# Patient Record
Sex: Female | Born: 1985 | Race: White | Hispanic: No | Marital: Married | Smoking: Never smoker
Health system: Southern US, Community
[De-identification: ages and names within clinical notes are randomized; demographics above are authoritative.]

## PROBLEM LIST (undated history)

## (undated) ENCOUNTER — Inpatient Hospital Stay (HOSPITAL_COMMUNITY): Payer: Self-pay

## (undated) DIAGNOSIS — Z8619 Personal history of other infectious and parasitic diseases: Secondary | ICD-10-CM

## (undated) DIAGNOSIS — R1110 Vomiting, unspecified: Secondary | ICD-10-CM

## (undated) HISTORY — PX: CHOLECYSTECTOMY: SHX55

## (undated) HISTORY — PX: OTHER SURGICAL HISTORY: SHX169

## (undated) HISTORY — DX: Personal history of other infectious and parasitic diseases: Z86.19

## (undated) HISTORY — DX: Vomiting, unspecified: R11.10

---

## 2009-04-15 ENCOUNTER — Inpatient Hospital Stay (HOSPITAL_COMMUNITY): Admission: AD | Admit: 2009-04-15 | Discharge: 2009-04-15 | Payer: Self-pay | Admitting: Obstetrics and Gynecology

## 2009-04-26 ENCOUNTER — Inpatient Hospital Stay (HOSPITAL_COMMUNITY): Admission: AD | Admit: 2009-04-26 | Discharge: 2009-04-27 | Payer: Self-pay | Admitting: Obstetrics and Gynecology

## 2009-09-22 ENCOUNTER — Inpatient Hospital Stay (HOSPITAL_COMMUNITY): Admission: AD | Admit: 2009-09-22 | Discharge: 2009-09-22 | Payer: Self-pay | Admitting: Obstetrics and Gynecology

## 2009-09-30 ENCOUNTER — Inpatient Hospital Stay (HOSPITAL_COMMUNITY): Admission: AD | Admit: 2009-09-30 | Discharge: 2009-09-30 | Payer: Self-pay | Admitting: Obstetrics and Gynecology

## 2009-11-06 ENCOUNTER — Inpatient Hospital Stay (HOSPITAL_COMMUNITY): Admission: AD | Admit: 2009-11-06 | Discharge: 2009-11-08 | Payer: Self-pay | Admitting: Obstetrics & Gynecology

## 2011-02-28 LAB — CBC
HCT: 30.1 % — ABNORMAL LOW (ref 36.0–46.0)
MCV: 88.9 fL (ref 78.0–100.0)
Platelets: 236 10*3/uL (ref 150–400)
RBC: 3.38 MIL/uL — ABNORMAL LOW (ref 3.87–5.11)
RDW: 14.2 % (ref 11.5–15.5)
WBC: 13.9 10*3/uL — ABNORMAL HIGH (ref 4.0–10.5)
WBC: 19.8 10*3/uL — ABNORMAL HIGH (ref 4.0–10.5)

## 2011-02-28 LAB — RPR: RPR Ser Ql: NONREACTIVE

## 2011-02-28 LAB — COMPREHENSIVE METABOLIC PANEL
CO2: 20 mEq/L (ref 19–32)
Calcium: 9.2 mg/dL (ref 8.4–10.5)
Chloride: 105 mEq/L (ref 96–112)
Glucose, Bld: 89 mg/dL (ref 70–99)
Total Protein: 6 g/dL (ref 6.0–8.3)

## 2011-02-28 LAB — LACTATE DEHYDROGENASE: LDH: 184 U/L (ref 94–250)

## 2011-03-07 LAB — URINALYSIS, DIPSTICK ONLY
Bilirubin Urine: NEGATIVE
Glucose, UA: 250 mg/dL — AB
Ketones, ur: >80 mg/dL — AB
Leukocytes, UA: NEGATIVE
Protein, ur: NEGATIVE mg/dL
Specific Gravity, Urine: 1.015 (ref 1.005–1.030)

## 2011-03-07 LAB — URINE MICROSCOPIC-ADD ON

## 2011-03-07 LAB — URINALYSIS, ROUTINE W REFLEX MICROSCOPIC
Glucose, UA: NEGATIVE mg/dL
Ketones, ur: >80 mg/dL — AB
Nitrite: NEGATIVE
Specific Gravity, Urine: >1.03 — ABNORMAL HIGH (ref 1.005–1.030)
pH: 5.5 (ref 5.0–8.0)
pH: 6 (ref 5.0–8.0)

## 2012-11-27 NOTE — L&D Delivery Note (Signed)
SVD of VMI.  Cord pH and weight pending.  EBL 350cc.  [place]at[place] to L&D.   Head delivered LOA with body delivered atraumatically.  Baby to abdomen.  Cord clamped and cut.  Cord pH obtained.  Placenta delivered S/I/3VC.  Fundus firmed with pitocin and massage.  Vaginal laceration and bilateral periurethral laceration repaired with 3-0 Vicryl in the normal fashion.  Mom and baby stable.

## 2013-02-21 ENCOUNTER — Encounter (HOSPITAL_COMMUNITY): Payer: Self-pay | Admitting: *Deleted

## 2013-02-21 ENCOUNTER — Inpatient Hospital Stay (HOSPITAL_COMMUNITY)
Admission: AD | Admit: 2013-02-21 | Discharge: 2013-02-21 | Disposition: A | Discharge status: Home / Self Care | Payer: BC Managed Care – PPO | Source: Ambulatory Visit | Attending: Obstetrics and Gynecology | Admitting: Obstetrics and Gynecology

## 2013-02-21 DIAGNOSIS — O219 Vomiting of pregnancy, unspecified: Secondary | ICD-10-CM

## 2013-02-21 DIAGNOSIS — O210 Mild hyperemesis gravidarum: Secondary | ICD-10-CM | POA: Insufficient documentation

## 2013-02-21 LAB — URINALYSIS, ROUTINE W REFLEX MICROSCOPIC
Glucose, UA: NEGATIVE mg/dL
Hgb urine dipstick: NEGATIVE
Leukocytes, UA: NEGATIVE

## 2013-02-21 MED ORDER — PROMETHAZINE HCL 25 MG/ML IJ SOLN
25.0000 mg | Freq: Once | INTRAVENOUS | Status: AC
  Administered 2013-02-21: 25 mg via INTRAVENOUS
  Filled 2013-02-21: qty 1

## 2013-02-21 MED ORDER — ONDANSETRON HCL 4 MG/2ML IJ SOLN
4.0000 mg | INTRAMUSCULAR | Status: AC
  Administered 2013-02-21: 4 mg via INTRAVENOUS
  Filled 2013-02-21: qty 2

## 2013-02-21 NOTE — MAU Provider Note (Signed)
  History     CSN: [id number]  Arrival date and time: 02/21/13 4098   First Provider Initiated Contact with Patient 02/21/13 2129      Chief Complaint  Patient presents with  . Morning Sickness  . Dehydration   HPI  Barbara Neal is a 27 y.o. G2P1001 at [redacted]w[redacted]d who presents today with nausea and vomiting. She states that she has been very nauseous for the whole pregnancy at this point, and has not been vomiting much. However, she has been unable to eat or drink due to the nausea. She denies any abdominal pain or vaginal bleeding. She denies any complications with the pregnancy at this time. She was seen by her primary OB last week and had an ultrasound in the office. She states that "everything was ok" at the ultrasound.   History reviewed. No pertinent past medical history.  Past Surgical History  Procedure Laterality Date  . Cholecystectomy    . Arm surgery      History reviewed. No pertinent family history.  History  Substance Use Topics  . Smoking status: Never Smoker   . Smokeless tobacco: Never Used  . Alcohol Use: No    Allergies: No Known Allergies  No prescriptions prior to admission    Review of Systems  Constitutional: Negative for fever and chills.  Eyes: Negative for blurred vision.  Gastrointestinal: Positive for nausea and vomiting. Negative for abdominal pain, diarrhea and constipation.  Genitourinary: Negative for dysuria, urgency and frequency.  Musculoskeletal: Negative for myalgias.  Neurological: Negative for dizziness and headaches.   Physical Exam   Blood pressure 127/72, pulse 85, temperature 98.1 F (36.7 C), resp. rate 20, height 5\' 4"  (1.626 m), weight 83.734 kg (184 lb 9.6 oz), last menstrual period 12/24/2012, SpO2 100.00%.  Physical Exam  Nursing note and vitals reviewed. Constitutional: She is oriented to person, place, and time. She appears well-developed and well-nourished. No distress.  Cardiovascular: Normal rate.    Respiratory: Effort normal.  GI: Soft. She exhibits no distension. There is no tenderness.  Neurological: She is alert and oriented to person, place, and time.  Skin: Skin is warm and dry.  Psychiatric: She has a normal mood and affect.    MAU Course  Procedures  Results for orders placed during the hospital encounter of 02/21/13 (from the past 24 hour(s))  URINALYSIS, ROUTINE W REFLEX MICROSCOPIC     Status: Abnormal   Collection Time    02/21/13  7:20 PM      Result Value Range   Color, Urine YELLOW  YELLOW   APPearance CLEAR  CLEAR   Specific Gravity, Urine >1.030 (*) 1.005 - 1.030   pH 6.0  5.0 - 8.0   Glucose, UA NEGATIVE  NEGATIVE mg/dL   Hgb urine dipstick NEGATIVE  NEGATIVE   Bilirubin Urine NEGATIVE  NEGATIVE   Ketones, ur 40 (*) NEGATIVE mg/dL   Protein, ur NEGATIVE  NEGATIVE mg/dL   Urobilinogen, UA 0.2  0.0 - 1.0 mg/dL   Nitrite NEGATIVE  NEGATIVE   Leukocytes, UA NEGATIVE  NEGATIVE    2205: pt reports feeling much better and ready to go home.  Assessment and Plan   1. Nausea and vomiting in pregnancy prior to [redacted] weeks gestation    Discussed comfort measures First trimester danger signs reviewed FU with PCP as scheduled  Tawnya Crook 02/21/2013, 9:29 PM

## 2013-02-21 NOTE — MAU Note (Signed)
I'm really nauseated and just do not want to eat. No as much vomiting. I'm dehydrated

## 2013-02-26 LAB — OB RESULTS CONSOLE HEPATITIS B SURFACE ANTIGEN: Hepatitis B Surface Ag: NEGATIVE

## 2013-02-26 LAB — OB RESULTS CONSOLE ABO/RH

## 2013-02-26 LAB — OB RESULTS CONSOLE ANTIBODY SCREEN: Antibody Screen: NEGATIVE

## 2013-02-26 LAB — OB RESULTS CONSOLE RUBELLA ANTIBODY, IGM: Rubella: IMMUNE

## 2013-02-28 ENCOUNTER — Inpatient Hospital Stay (HOSPITAL_COMMUNITY)
Admission: AD | Admit: 2013-02-28 | Discharge: 2013-02-28 | Disposition: A | Discharge status: Home / Self Care | Payer: BC Managed Care – PPO | Source: Ambulatory Visit | Attending: Obstetrics & Gynecology | Admitting: Obstetrics & Gynecology

## 2013-02-28 ENCOUNTER — Encounter (HOSPITAL_COMMUNITY): Payer: Self-pay | Admitting: *Deleted

## 2013-02-28 DIAGNOSIS — O210 Mild hyperemesis gravidarum: Secondary | ICD-10-CM | POA: Insufficient documentation

## 2013-02-28 DIAGNOSIS — R197 Diarrhea, unspecified: Secondary | ICD-10-CM | POA: Insufficient documentation

## 2013-02-28 DIAGNOSIS — O219 Vomiting of pregnancy, unspecified: Secondary | ICD-10-CM

## 2013-02-28 LAB — COMPREHENSIVE METABOLIC PANEL
ALT: 38 U/L — ABNORMAL HIGH (ref 0–35)
AST: 29 U/L (ref 0–37)
Albumin: 3.9 g/dL (ref 3.5–5.2)
BUN: 8 mg/dL (ref 6–23)
Chloride: 99 mEq/L (ref 96–112)
GFR calc Af Amer: >90 mL/min (ref >90)
GFR calc non Af Amer: >90 mL/min (ref >90)
Glucose, Bld: 159 mg/dL — ABNORMAL HIGH (ref 70–99)
Potassium: 3.8 mEq/L (ref 3.5–5.1)
Sodium: 132 mEq/L — ABNORMAL LOW (ref 135–145)
Total Bilirubin: 0.4 mg/dL (ref 0.3–1.2)

## 2013-02-28 LAB — URINALYSIS, ROUTINE W REFLEX MICROSCOPIC
Glucose, UA: NEGATIVE mg/dL
Protein, ur: 30 mg/dL — AB
Specific Gravity, Urine: >1.03 — ABNORMAL HIGH (ref 1.005–1.030)

## 2013-02-28 LAB — URINE MICROSCOPIC-ADD ON

## 2013-02-28 MED ORDER — ONDANSETRON HCL 4 MG/2ML IJ SOLN
4.0000 mg | Freq: Once | INTRAMUSCULAR | Status: AC
  Administered 2013-02-28: 4 mg via INTRAVENOUS
  Filled 2013-02-28: qty 2

## 2013-02-28 MED ORDER — PROMETHAZINE HCL 25 MG/ML IJ SOLN
25.0000 mg | Freq: Once | INTRAVENOUS | Status: AC
  Administered 2013-02-28: 25 mg via INTRAVENOUS
  Filled 2013-02-28: qty 1

## 2013-02-28 MED ORDER — M.V.I. ADULT IV INJ
Freq: Once | INTRAVENOUS | Status: AC
  Administered 2013-02-28: 18:00:00 via INTRAVENOUS
  Filled 2013-02-28: qty 1000

## 2013-02-28 MED ORDER — PROMETHAZINE HCL 25 MG RE SUPP: 25.0000 mg | Freq: Four times a day (QID) | RECTAL | Status: DC | PRN

## 2013-02-28 MED ORDER — ONDANSETRON HCL 4 MG PO TABS: 4.0000 mg | ORAL_TABLET | Freq: Four times a day (QID) | ORAL | Status: DC

## 2013-02-28 NOTE — MAU Note (Signed)
Patient states she has been unable to keep anything down for 2 days. Medication is not working. Has diarrhea 3-4 times in the past week. Denies pain or bleeding.

## 2013-02-28 NOTE — MAU Provider Note (Signed)
History     CSN: [id number]  Arrival date and time: 02/28/13 1508   First Provider Initiated Contact with Patient 02/28/13 1610      Chief Complaint  Patient presents with  . Emesis During Pregnancy  . Diarrhea   HPI Ms. RUTHANNA MACCHIA is a 27 y.o. G2P1001 at [redacted]w[redacted]d who presents to MAU today with N/V. The patient was seen on 02/21/13 and required IV hydration. She was sent home with Phenergan and Zofran PO. She felt better x 1 day and has been having N/V/D again since Sunday. She has not been able to keep much down at all. She denies abdominal pain, vaginal bleeding, abnormal discharge or fever.    OB History   Grav Para Term Preterm Abortions TAB SAB Ect Mult Living   2 1 1       1       Past Medical History  Diagnosis Date  . Medical history non-contributory     Past Surgical History  Procedure Laterality Date  . Cholecystectomy    . Arm surgery      History reviewed. No pertinent family history.  History  Substance Use Topics  . Smoking status: Never Smoker   . Smokeless tobacco: Never Used  . Alcohol Use: No    Allergies: No Known Allergies  Prescriptions prior to admission  Medication Sig Dispense Refill  . ondansetron (ZOFRAN) 4 MG tablet Take 4 mg by mouth every 8 (eight) hours as needed for nausea.      . promethazine (PHENERGAN) 12.5 MG tablet Take 12.5 mg by mouth every 6 (six) hours as needed for nausea.        Review of Systems  Constitutional: Negative for fever and malaise/fatigue.  Gastrointestinal: Positive for nausea, vomiting and diarrhea. Negative for abdominal pain and constipation.  Genitourinary: Negative for dysuria, urgency and frequency.       NEG - vaginal bleeding or discharge  Musculoskeletal: Negative for back pain.  Neurological: Positive for dizziness, weakness and headaches. Negative for loss of consciousness.   Physical Exam   Blood pressure 115/81, pulse 86, temperature 98.4 F (36.9 C), temperature source Oral, resp.  rate 16, height 5\' 4"  (1.626 m), weight 177 lb 6.4 oz (80.468 kg), last menstrual period 12/24/2012, SpO2 100.00%.  Physical Exam  Constitutional: She is oriented to person, place, and time. She appears well-developed and well-nourished. No distress.  HENT:  Head: Normocephalic and atraumatic.  Cardiovascular: Normal rate, regular rhythm and normal heart sounds.   Respiratory: Effort normal and breath sounds normal. No respiratory distress.  GI: Soft. Bowel sounds are normal. She exhibits no distension and no mass. There is tenderness (mild tenderness to palpation of the upper abdomen). There is no rebound and no guarding.  Neurological: She is alert and oriented to person, place, and time.  Skin: Skin is warm and dry. No erythema.  Psychiatric: She has a normal mood and affect.   Results for orders placed during the hospital encounter of 02/28/13 (from the past 24 hour(s))  URINALYSIS, ROUTINE W REFLEX MICROSCOPIC     Status: Abnormal   Collection Time    02/28/13  3:22 PM      Result Value Range   Color, Urine AMBER (*) YELLOW   APPearance HAZY (*) CLEAR   Specific Gravity, Urine >1.030 (*) 1.005 - 1.030   pH 6.0  5.0 - 8.0   Glucose, UA NEGATIVE  NEGATIVE mg/dL   Hgb urine dipstick TRACE (*) NEGATIVE   Bilirubin Urine  SMALL (*) NEGATIVE   Ketones, ur >80 (*) NEGATIVE mg/dL   Protein, ur 30 (*) NEGATIVE mg/dL   Urobilinogen, UA 0.2  0.0 - 1.0 mg/dL   Nitrite NEGATIVE  NEGATIVE   Leukocytes, UA SMALL (*) NEGATIVE  URINE MICROSCOPIC-ADD ON     Status: Abnormal   Collection Time    02/28/13  3:22 PM      Result Value Range   Squamous Epithelial / LPF FEW (*) RARE   WBC, UA 3-6  <3 WBC/hpf   Bacteria, UA FEW (*) RARE   Urine-Other MUCOUS PRESENT      MAU Course  Procedures None  MDM Discussed patient with Dr. Langston Masker. IV phenergan infusion with 4 mg Zofran today. CMP to check electrolytes and possible MV bag.   CMP results delayed and unavailable when second bag needed. 1 L  LR with MV ordered Patient feels significant improvement  Assessment and Plan  A: Nausea and vomiting in pregnancy  P: Discharge home Rx for Zofran and Phenergan suppositories sent to the patient's pharmacy Patient encouraged to follow-up with [facility] as scheduled or earlier if symptoms worsen Encouraged increased PO hydration as tolerated Patient may return to MAU as needed or if her condition were to change or worsen  Freddi Starr, PA-C  02/28/2013, 4:12 PM

## 2013-03-01 LAB — URINE CULTURE: Colony Count: 35000

## 2013-03-09 ENCOUNTER — Encounter (HOSPITAL_COMMUNITY): Payer: Self-pay | Admitting: *Deleted

## 2013-03-09 ENCOUNTER — Inpatient Hospital Stay (HOSPITAL_COMMUNITY)
Admission: AD | Admit: 2013-03-09 | Discharge: 2013-03-09 | Disposition: A | Discharge status: Home / Self Care | Payer: BC Managed Care – PPO | Source: Ambulatory Visit | Attending: Obstetrics and Gynecology | Admitting: Obstetrics and Gynecology

## 2013-03-09 DIAGNOSIS — O210 Mild hyperemesis gravidarum: Secondary | ICD-10-CM | POA: Insufficient documentation

## 2013-03-09 LAB — URINALYSIS, ROUTINE W REFLEX MICROSCOPIC
Glucose, UA: NEGATIVE mg/dL
Hgb urine dipstick: NEGATIVE
Specific Gravity, Urine: >1.03 — ABNORMAL HIGH (ref 1.005–1.030)

## 2013-03-09 LAB — URINE MICROSCOPIC-ADD ON

## 2013-03-09 MED ORDER — LACTATED RINGERS IV SOLN: INTRAVENOUS | Status: DC

## 2013-03-09 MED ORDER — ONDANSETRON 8 MG PO TBDP: 8.0000 mg | ORAL_TABLET | Freq: Once | ORAL | Status: DC

## 2013-03-09 MED ORDER — PROMETHAZINE HCL 25 MG/ML IJ SOLN
12.5000 mg | Freq: Once | INTRAMUSCULAR | Status: AC
  Administered 2013-03-09: 12.5 mg via INTRAVENOUS
  Filled 2013-03-09: qty 1

## 2013-03-09 MED ORDER — PYRIDOXINE HCL 100 MG/ML IJ SOLN
100.0000 mg | Freq: Once | INTRAMUSCULAR | Status: AC
  Administered 2013-03-09: 100 mg via INTRAVENOUS
  Filled 2013-03-09: qty 1

## 2013-03-09 MED ORDER — METOCLOPRAMIDE HCL 5 MG/ML IJ SOLN
10.0000 mg | Freq: Once | INTRAMUSCULAR | Status: AC
  Administered 2013-03-09: 10 mg via INTRAVENOUS
  Filled 2013-03-09: qty 2

## 2013-03-09 MED ORDER — METOCLOPRAMIDE HCL 10 MG PO TABS: 10.0000 mg | ORAL_TABLET | Freq: Four times a day (QID) | ORAL | Status: DC

## 2013-03-09 NOTE — MAU Note (Signed)
Has been throwing up for 2 weeks. Has been seen in er 3x this pregnancy

## 2013-03-09 NOTE — MAU Provider Note (Signed)
History     CSN: [id number]  Arrival date and time: 03/09/13 1207   None     Chief Complaint  Patient presents with  . Dehydration   HPI This is a 27 y.o. female at [redacted]w[redacted]d who presents with persistent nausea and vomiting. Has had this for 2 weeks. Denies fever or diarrhea.  Wanted to make sure baby was all right.   RN Note:   Has been throwing up for 2 weeks. Has been seen in er 3x this pregnancy      OB History   Grav Para Term Preterm Abortions TAB SAB Ect Mult Living   2 1 1       1       Past Medical History  Diagnosis Date  . Medical history non-contributory     Past Surgical History  Procedure Laterality Date  . Cholecystectomy    . Arm surgery      History reviewed. No pertinent family history.  History  Substance Use Topics  . Smoking status: Never Smoker   . Smokeless tobacco: Never Used  . Alcohol Use: No    Allergies: No Known Allergies  Prescriptions prior to admission  Medication Sig Dispense Refill  . ondansetron (ZOFRAN) 4 MG tablet Take 4 mg by mouth every 8 (eight) hours as needed for nausea.      . promethazine (PHENERGAN) 12.5 MG tablet Take 12.5 mg by mouth at bedtime as needed for nausea.       . promethazine (PHENERGAN) 25 MG suppository Place 1 suppository (25 mg total) rectally every 6 (six) hours as needed for nausea.  12 each  0    Review of Systems  Constitutional: Negative for fever, chills and malaise/fatigue.  Gastrointestinal: Positive for nausea and vomiting. Negative for abdominal pain, diarrhea and constipation.  Genitourinary: Negative for dysuria.  Neurological: Positive for weakness. Negative for dizziness and headaches.   Physical Exam   Blood pressure 118/73, pulse 82, temperature 97.5 F (36.4 C), temperature source Oral, resp. rate 18, last menstrual period 12/24/2012.  Physical Exam  Constitutional: She is oriented to person, place, and time. She appears well-developed. No distress.  HENT:  Head:  Normocephalic.  Cardiovascular: Normal rate.   Respiratory: Effort normal.  GI: Soft. She exhibits no distension and no mass. There is no tenderness. There is no rebound and no guarding.  Musculoskeletal: Normal range of motion.  Neurological: She is alert and oriented to person, place, and time.  Skin: Skin is warm and dry.  Psychiatric: She has a normal mood and affect.    MAU Course  Procedures  IV fluids given. Phenergan, Reglan, and Vitamin B6 was given with good relief. Discussed with Dr Arelia Sneddon.    Assessment and Plan  A:  SIUP at [redacted]w[redacted]d       Hyperemesis  P:  Discharge home         Medication List    TAKE these medications       metoCLOPramide 10 MG tablet  Commonly known as:  REGLAN  Take 1 tablet (10 mg total) by mouth 4 (four) times daily.     ondansetron 4 MG tablet  Commonly known as:  ZOFRAN  Take 4 mg by mouth every 8 (eight) hours as needed for nausea.     promethazine 12.5 MG tablet  Commonly known as:  PHENERGAN  Take 12.5 mg by mouth at bedtime as needed for nausea.     promethazine 25 MG suppository  Commonly known as:  PHENERGAN  Place 1 suppository (25 mg total) rectally every 6 (six) hours as needed for nausea.             Follow up in office  [facility] 03/09/2013, 1:43 PM

## 2013-03-10 LAB — URINE CULTURE
Colony Count: NO GROWTH
Culture: NO GROWTH

## 2013-03-20 ENCOUNTER — Encounter (HOSPITAL_COMMUNITY): Payer: Self-pay | Admitting: *Deleted

## 2013-03-20 ENCOUNTER — Inpatient Hospital Stay (HOSPITAL_COMMUNITY)
Admission: AD | Admit: 2013-03-20 | Discharge: 2013-03-20 | Disposition: A | Discharge status: Home / Self Care | Payer: BC Managed Care – PPO | Source: Ambulatory Visit | Attending: Obstetrics & Gynecology | Admitting: Obstetrics & Gynecology

## 2013-03-20 DIAGNOSIS — O210 Mild hyperemesis gravidarum: Secondary | ICD-10-CM | POA: Insufficient documentation

## 2013-03-20 DIAGNOSIS — O219 Vomiting of pregnancy, unspecified: Secondary | ICD-10-CM

## 2013-03-20 LAB — URINALYSIS, ROUTINE W REFLEX MICROSCOPIC
Hgb urine dipstick: NEGATIVE
Nitrite: NEGATIVE
Protein, ur: 30 mg/dL — AB
Specific Gravity, Urine: >1.03 — ABNORMAL HIGH (ref 1.005–1.030)
Urobilinogen, UA: 0.2 mg/dL (ref 0.0–1.0)

## 2013-03-20 LAB — URINE MICROSCOPIC-ADD ON

## 2013-03-20 MED ORDER — DEXTROSE 5 % IN LACTATED RINGERS IV BOLUS
1000.0000 mL | Freq: Once | INTRAVENOUS | Status: AC
  Administered 2013-03-20: 1000 mL via INTRAVENOUS

## 2013-03-20 MED ORDER — ONDANSETRON 8 MG/NS 50 ML IVPB
8.0000 mg | Freq: Once | INTRAVENOUS | Status: AC
  Administered 2013-03-20: 8 mg via INTRAVENOUS
  Filled 2013-03-20: qty 8

## 2013-03-20 MED ORDER — PROMETHAZINE HCL 25 MG/ML IJ SOLN
25.0000 mg | Freq: Once | INTRAMUSCULAR | Status: AC
  Administered 2013-03-20: 25 mg via INTRAVENOUS
  Filled 2013-03-20: qty 1

## 2013-03-20 NOTE — MAU Note (Signed)
Ongoing nausea and vomiting.  Is on zofran and phenergan something else, doesn't really seem to help.

## 2013-03-20 NOTE — MAU Note (Signed)
Has lost 15#, 10 since end of March

## 2013-03-20 NOTE — MAU Provider Note (Signed)
History     CSN: [id number]  Arrival date and time: 03/20/13 1250   First Provider Initiated Contact with Patient 03/20/13 1336      Chief Complaint  Patient presents with  . Morning Sickness   HPI 27 y.o. G2P1001 at [redacted]w[redacted]d with ongoing n/v. Using scopolamine patch since yesterday, taking diclegis, zofran ODT, phenergan suppository. Nothing is helping. Has trouble keeping down pills and Zofran ODT. Has lost 10 lbs since 3/28. States she has lost 15 lbs all together.   Past Medical History  Diagnosis Date  . Medical history non-contributory     Past Surgical History  Procedure Laterality Date  . Cholecystectomy    . Arm surgery      History reviewed. No pertinent family history.  History  Substance Use Topics  . Smoking status: Never Smoker   . Smokeless tobacco: Never Used  . Alcohol Use: No    Allergies: No Known Allergies  Prescriptions prior to admission  Medication Sig Dispense Refill  . Doxylamine-Pyridoxine (DICLEGIS) 10-10 MG TBEC Take 1 tablet by mouth 3 (three) times daily. 1 tablet in the morning, 1 tablet at lunch & 2 tablets at bedtime      . ondansetron (ZOFRAN) 4 MG tablet Take 4 mg by mouth every 6 (six) hours as needed for nausea.       . promethazine (PHENERGAN) 12.5 MG tablet Take 12.5 mg by mouth at bedtime as needed for nausea.       . promethazine (PHENERGAN) 25 MG suppository Place 25 mg rectally at bedtime as needed for nausea.      . ranitidine (ZANTAC) 150 MG tablet Take 150 mg by mouth 2 (two) times daily.      Marland Kitchen scopolamine (TRANSDERM-SCOP) 1.5 MG Place 1 patch onto the skin every 3 (three) days.      . [DISCONTINUED] promethazine (PHENERGAN) 25 MG suppository Place 1 suppository (25 mg total) rectally every 6 (six) hours as needed for nausea.  12 each  0    Review of Systems  Constitutional: Positive for malaise/fatigue. Negative for fever.  Respiratory: Negative.   Cardiovascular: Negative.   Gastrointestinal: Positive for nausea and  vomiting. Negative for abdominal pain.  Genitourinary: Negative for dysuria, urgency, frequency, hematuria and flank pain.       Negative for vaginal bleeding  Musculoskeletal: Negative.   Neurological: Negative.   Psychiatric/Behavioral: Negative.    Physical Exam   Blood pressure 111/67, pulse 73, temperature 97.7 F (36.5 C), temperature source Oral, resp. rate 18, height 5' 3.5" (1.613 m), weight 174 lb (78.926 kg), last menstrual period 12/24/2012.  Physical Exam  Nursing note and vitals reviewed. Constitutional: She is oriented to person, place, and time. She appears well-developed and well-nourished. No distress.  Cardiovascular: Normal rate.   Respiratory: Effort normal.  GI: Soft. There is no tenderness.  Musculoskeletal: Normal range of motion.  Neurological: She is alert and oriented to person, place, and time.  Skin: Skin is warm and dry.  Psychiatric: She has a normal mood and affect.    MAU Course  Procedures Results for orders placed during the hospital encounter of 03/20/13 (from the past 24 hour(s))  URINALYSIS, ROUTINE W REFLEX MICROSCOPIC     Status: Abnormal   Collection Time    03/20/13  1:10 PM      Result Value Range   Color, Urine YELLOW  YELLOW   APPearance HAZY (*) CLEAR   Specific Gravity, Urine >1.030 (*) 1.005 - 1.030   pH 6.0  5.0 - 8.0   Glucose, UA NEGATIVE  NEGATIVE mg/dL   Hgb urine dipstick NEGATIVE  NEGATIVE   Bilirubin Urine SMALL (*) NEGATIVE   Ketones, ur >80 (*) NEGATIVE mg/dL   Protein, ur 30 (*) NEGATIVE mg/dL   Urobilinogen, UA 0.2  0.0 - 1.0 mg/dL   Nitrite NEGATIVE  NEGATIVE   Leukocytes, UA NEGATIVE  NEGATIVE  URINE MICROSCOPIC-ADD ON     Status: Abnormal   Collection Time    03/20/13  1:10 PM      Result Value Range   Squamous Epithelial / LPF RARE  RARE   WBC, UA 7-10  <3 WBC/hpf   Bacteria, UA FEW (*) RARE   Urine-Other MUCOUS PRESENT     IV hydration with D5LR, Zofran 8 mg IV and Phenergan 25 mg IV. Pt feeling a  little better, tolerating ice chips.   Assessment and Plan  27 y.o. G2P1001 at [redacted]w[redacted]d with n/v of pregnancy Continue meds as prescribed, info given on diet, f/u with Dr. Langston Masker as scheduled or sooner PRN  1. Excessive vomiting in pregnancy       Medication List    TAKE these medications       DICLEGIS 10-10 MG Tbec  Generic drug:  Doxylamine-Pyridoxine  Take 1 tablet by mouth 3 (three) times daily. 1 tablet in the morning, 1 tablet at lunch & 2 tablets at bedtime     ondansetron 4 MG tablet  Commonly known as:  ZOFRAN  Take 4 mg by mouth every 6 (six) hours as needed for nausea.     promethazine 12.5 MG tablet  Commonly known as:  PHENERGAN  Take 12.5 mg by mouth at bedtime as needed for nausea.     promethazine 25 MG suppository  Commonly known as:  PHENERGAN  Place 25 mg rectally at bedtime as needed for nausea.     ranitidine 150 MG tablet  Commonly known as:  ZANTAC  Take 150 mg by mouth 2 (two) times daily.     scopolamine 1.5 MG  Commonly known as:  TRANSDERM-SCOP  Place 1 patch onto the skin every 3 (three) days.            Follow-up Information   Follow up with MORRIS, MEGAN, DO. (as scheduled or sooner as needed)    Contact information:   [address] [place] [address] [phone]          Georges Mouse 03/20/2013, 1:36 PM

## 2013-03-21 LAB — URINE CULTURE
Colony Count: NO GROWTH
Culture: NO GROWTH

## 2013-07-23 ENCOUNTER — Encounter: Payer: BC Managed Care – PPO | Attending: Obstetrics & Gynecology

## 2013-07-23 VITALS — Ht 65.0 in | Wt 198.6 lb

## 2013-07-23 DIAGNOSIS — O99810 Abnormal glucose complicating pregnancy: Secondary | ICD-10-CM | POA: Insufficient documentation

## 2013-07-23 DIAGNOSIS — Z713 Dietary counseling and surveillance: Secondary | ICD-10-CM | POA: Insufficient documentation

## 2013-07-29 NOTE — Progress Notes (Signed)
  Patient was seen on 07/23/13 for Gestational Diabetes self-management class at the [facility]. The following learning objectives were met by the patient during this course:   States the definition of Gestational Diabetes  States why dietary management is important in controlling blood glucose  Describes the effects each nutrient has on blood glucose levels  Demonstrates ability to create a balanced meal plan  Demonstrates carbohydrate counting   States when to check blood glucose levels  Demonstrates proper blood glucose monitoring techniques  States the effect of stress and exercise on blood glucose levels  States the importance of limiting caffeine and abstaining from alcohol and smoking  Blood glucose monitor given:  One Touch Ultra Mini Self Monitoring Kit  Lot # [id number] X Exp: 06/2013 Blood glucose reading: 60  Patient instructed to monitor glucose levels: FBS: 60 - <90 1 hour: <140 2 hour: <120  *Patient received handouts:  Nutrition Diabetes and Pregnancy  Carbohydrate Counting List  Meal Plan Work Sheet  Patient will be seen for follow-up as needed.

## 2013-07-29 NOTE — Patient Instructions (Signed)
Goals:  Check glucose levels per MD as instructed  Follow Gestational Diabetes Diet as instructed  Call for follow-up as needed    

## 2013-09-30 ENCOUNTER — Telehealth (HOSPITAL_COMMUNITY): Payer: Self-pay | Admitting: *Deleted

## 2013-09-30 ENCOUNTER — Encounter (HOSPITAL_COMMUNITY): Payer: Self-pay

## 2013-09-30 ENCOUNTER — Encounter (HOSPITAL_COMMUNITY): Payer: Self-pay | Admitting: *Deleted

## 2013-09-30 ENCOUNTER — Inpatient Hospital Stay (HOSPITAL_COMMUNITY)
Admission: AD | Admit: 2013-09-30 | Discharge: 2013-10-03 | DRG: 775 | Disposition: A | Discharge status: Home / Self Care | Payer: BC Managed Care – PPO | Source: Ambulatory Visit | Attending: Obstetrics and Gynecology | Admitting: Obstetrics and Gynecology

## 2013-09-30 DIAGNOSIS — O99814 Abnormal glucose complicating childbirth: Principal | ICD-10-CM | POA: Diagnosis present

## 2013-09-30 LAB — POCT FERN TEST: POCT Fern Test: POSITIVE

## 2013-09-30 NOTE — Telephone Encounter (Signed)
Preadmission screen  

## 2013-09-30 NOTE — MAU Note (Signed)
Large gush of clear fluid at 10 pm tonight.

## 2013-10-01 ENCOUNTER — Encounter (HOSPITAL_COMMUNITY): Payer: Self-pay | Admitting: Obstetrics

## 2013-10-01 ENCOUNTER — Inpatient Hospital Stay (HOSPITAL_COMMUNITY): Payer: BC Managed Care – PPO | Admitting: Anesthesiology

## 2013-10-01 ENCOUNTER — Inpatient Hospital Stay (HOSPITAL_COMMUNITY): Admission: RE | Admit: 2013-10-01 | Payer: BC Managed Care – PPO | Source: Ambulatory Visit

## 2013-10-01 ENCOUNTER — Encounter (HOSPITAL_COMMUNITY): Payer: BC Managed Care – PPO | Admitting: Anesthesiology

## 2013-10-01 LAB — TYPE AND SCREEN
ABO/RH(D): O POS
Antibody Screen: NEGATIVE

## 2013-10-01 LAB — CBC
HCT: 35.7 % — ABNORMAL LOW (ref 36.0–46.0)
Hemoglobin: 12 g/dL (ref 12.0–15.0)
MCH: 27.8 pg (ref 26.0–34.0)
MCHC: 33.6 g/dL (ref 30.0–36.0)
MCV: 82.8 fL (ref 78.0–100.0)
RDW: 14.3 % (ref 11.5–15.5)
WBC: 13.5 10*3/uL — ABNORMAL HIGH (ref 4.0–10.5)

## 2013-10-01 MED ORDER — SENNOSIDES-DOCUSATE SODIUM 8.6-50 MG PO TABS
2.0000 | ORAL_TABLET | ORAL | Status: DC
  Administered 2013-10-01 – 2013-10-02 (×2): 2 via ORAL
  Filled 2013-10-01 (×2): qty 2

## 2013-10-01 MED ORDER — FLEET ENEMA 7-19 GM/118ML RE ENEM: 1.0000 | ENEMA | RECTAL | Status: DC | PRN

## 2013-10-01 MED ORDER — CITRIC ACID-SODIUM CITRATE 334-500 MG/5ML PO SOLN: 30.0000 mL | ORAL | Status: DC | PRN

## 2013-10-01 MED ORDER — EPHEDRINE 5 MG/ML INJ
10.0000 mg | INTRAVENOUS | Status: DC | PRN
  Filled 2013-10-01: qty 4
  Filled 2013-10-01: qty 2

## 2013-10-01 MED ORDER — FENTANYL 2.5 MCG/ML BUPIVACAINE 1/10 % EPIDURAL INFUSION (WH - ANES)
14.0000 mL/h | INTRAMUSCULAR | Status: DC | PRN
  Administered 2013-10-01: 14 mL/h via EPIDURAL
  Filled 2013-10-01: qty 125

## 2013-10-01 MED ORDER — EPHEDRINE 5 MG/ML INJ
10.0000 mg | INTRAVENOUS | Status: DC | PRN
  Filled 2013-10-01: qty 2

## 2013-10-01 MED ORDER — OXYCODONE-ACETAMINOPHEN 5-325 MG PO TABS: 1.0000 | ORAL_TABLET | ORAL | Status: DC | PRN

## 2013-10-01 MED ORDER — ACETAMINOPHEN 325 MG PO TABS: 650.0000 mg | ORAL_TABLET | ORAL | Status: DC | PRN

## 2013-10-01 MED ORDER — LIDOCAINE HCL (PF) 1 % IJ SOLN
30.0000 mL | INTRAMUSCULAR | Status: DC | PRN
  Filled 2013-10-01 (×2): qty 30

## 2013-10-01 MED ORDER — DIPHENHYDRAMINE HCL 50 MG/ML IJ SOLN: 12.5000 mg | INTRAMUSCULAR | Status: DC | PRN

## 2013-10-01 MED ORDER — LACTATED RINGERS IV SOLN: 500.0000 mL | INTRAVENOUS | Status: DC | PRN

## 2013-10-01 MED ORDER — OXYTOCIN BOLUS FROM INFUSION: 500.0000 mL | INTRAVENOUS | Status: DC

## 2013-10-01 MED ORDER — DIBUCAINE 1 % RE OINT: 1.0000 "application " | TOPICAL_OINTMENT | RECTAL | Status: DC | PRN

## 2013-10-01 MED ORDER — ZOLPIDEM TARTRATE 5 MG PO TABS: 5.0000 mg | ORAL_TABLET | Freq: Every evening | ORAL | Status: DC | PRN

## 2013-10-01 MED ORDER — IBUPROFEN 600 MG PO TABS: 600.0000 mg | ORAL_TABLET | Freq: Four times a day (QID) | ORAL | Status: DC | PRN

## 2013-10-01 MED ORDER — LACTATED RINGERS IV SOLN
INTRAVENOUS | Status: DC
  Administered 2013-10-01 (×2): via INTRAVENOUS

## 2013-10-01 MED ORDER — LANOLIN HYDROUS EX OINT: TOPICAL_OINTMENT | CUTANEOUS | Status: DC | PRN

## 2013-10-01 MED ORDER — ONDANSETRON HCL 4 MG PO TABS: 4.0000 mg | ORAL_TABLET | ORAL | Status: DC | PRN

## 2013-10-01 MED ORDER — LACTATED RINGERS IV SOLN
500.0000 mL | Freq: Once | INTRAVENOUS | Status: AC
  Administered 2013-10-01: 500 mL via INTRAVENOUS

## 2013-10-01 MED ORDER — BENZOCAINE-MENTHOL 20-0.5 % EX AERO
1.0000 "application " | INHALATION_SPRAY | CUTANEOUS | Status: DC | PRN
  Administered 2013-10-02: 1 via TOPICAL
  Filled 2013-10-01: qty 56

## 2013-10-01 MED ORDER — OXYTOCIN 40 UNITS IN LACTATED RINGERS INFUSION - SIMPLE MED
62.5000 mL/h | INTRAVENOUS | Status: DC
  Administered 2013-10-01: 62.5 mL/h via INTRAVENOUS
  Filled 2013-10-01: qty 1000

## 2013-10-01 MED ORDER — TETANUS-DIPHTH-ACELL PERTUSSIS 5-2.5-18.5 LF-MCG/0.5 IM SUSP: 0.5000 mL | Freq: Once | INTRAMUSCULAR | Status: DC

## 2013-10-01 MED ORDER — ONDANSETRON HCL 4 MG/2ML IJ SOLN
4.0000 mg | Freq: Four times a day (QID) | INTRAMUSCULAR | Status: DC | PRN
  Administered 2013-10-01: 4 mg via INTRAVENOUS
  Filled 2013-10-01: qty 2

## 2013-10-01 MED ORDER — PRENATAL MULTIVITAMIN CH
1.0000 | ORAL_TABLET | Freq: Every day | ORAL | Status: DC
  Administered 2013-10-01 – 2013-10-03 (×3): 1 via ORAL
  Filled 2013-10-01 (×3): qty 1

## 2013-10-01 MED ORDER — PHENYLEPHRINE 40 MCG/ML (10ML) SYRINGE FOR IV PUSH (FOR BLOOD PRESSURE SUPPORT)
80.0000 ug | PREFILLED_SYRINGE | INTRAVENOUS | Status: DC | PRN
  Filled 2013-10-01: qty 2
  Filled 2013-10-01: qty 10

## 2013-10-01 MED ORDER — WITCH HAZEL-GLYCERIN EX PADS: 1.0000 "application " | MEDICATED_PAD | CUTANEOUS | Status: DC | PRN

## 2013-10-01 MED ORDER — IBUPROFEN 600 MG PO TABS
600.0000 mg | ORAL_TABLET | Freq: Four times a day (QID) | ORAL | Status: DC
  Administered 2013-10-01 – 2013-10-03 (×9): 600 mg via ORAL
  Filled 2013-10-01 (×9): qty 1

## 2013-10-01 MED ORDER — DIPHENHYDRAMINE HCL 25 MG PO CAPS: 25.0000 mg | ORAL_CAPSULE | Freq: Four times a day (QID) | ORAL | Status: DC | PRN

## 2013-10-01 MED ORDER — PHENYLEPHRINE 40 MCG/ML (10ML) SYRINGE FOR IV PUSH (FOR BLOOD PRESSURE SUPPORT)
80.0000 ug | PREFILLED_SYRINGE | INTRAVENOUS | Status: DC | PRN
  Filled 2013-10-01: qty 2

## 2013-10-01 MED ORDER — ONDANSETRON HCL 4 MG/2ML IJ SOLN: 4.0000 mg | INTRAMUSCULAR | Status: DC | PRN

## 2013-10-01 MED ORDER — LIDOCAINE HCL (PF) 1 % IJ SOLN
INTRAMUSCULAR | Status: DC | PRN
  Administered 2013-10-01 (×4): 4 mL

## 2013-10-01 MED ORDER — SIMETHICONE 80 MG PO CHEW: 80.0000 mg | CHEWABLE_TABLET | ORAL | Status: DC | PRN

## 2013-10-01 NOTE — Lactation Note (Signed)
This note was copied from the chart of Barbara Cadience Bradfield. Lactation Consultation Note      Initial consult with this mom and term baby, in NICU for hypoglycemia. I assisted mom with latching her baby in cross cradle hold. He latched well, despite mom having somewhat fla nipples, and suckled for a total of 60 minutes. No formula was given pc at this time.  Baby was able to have IV fluids weaned [facility] due to one touch of 63. Mom is pumping  After feeding the baby, and will bring EBM to each breast feeding.   Patient Name: Barbara Neal [id number] Date: 10/01/2013 Reason for consult: Follow-up assessment   Maternal Data Formula Feeding for Exclusion: Yes (baby in NICU) Infant to breast within first hour of birth: Yes  Feeding Feeding Type: Breast Fed Length of feed: 60 min  LATCH Score/Interventions Latch: Grasps breast easily, tongue down, lips flanged, rhythmical sucking.  Audible Swallowing: A few with stimulation  Type of Nipple: Flat  Comfort (Breast/Nipple): Soft / non-tender     Hold (Positioning): Assistance needed to correctly position infant at breast and maintain latch. Intervention(s): Support Pillows;Skin to skin;Breastfeeding basics reviewed;Position options  LATCH Score: 7  Lactation Tools Discussed/Used Tools: Pump Breast pump type: Double-Electric Breast Pump Initiated by:: bedside RN within 3 hours of delivery Date initiated:: 10/01/13   Consult Status Consult Status: Follow-up Date: 10/02/13 Follow-up type: In-patient    Alfred Levins 10/01/2013, 4:23 PM

## 2013-10-01 NOTE — Progress Notes (Signed)
Ur chart review completed.  

## 2013-10-01 NOTE — Anesthesia Procedure Notes (Signed)
Epidural Patient location during procedure: OB Start time: 10/01/2013 2:46 AM  Staffing Performed by: anesthesiologist   Preanesthetic Checklist Completed: patient identified, site marked, surgical consent, pre-op evaluation, timeout performed, IV checked, risks and benefits discussed and monitors and equipment checked  Epidural Patient position: sitting Prep: site prepped and draped and DuraPrep Patient monitoring: continuous pulse ox and blood pressure Approach: midline Injection technique: LOR air  Needle:  Needle type: Tuohy  Needle gauge: 17 G Needle length: 9 cm and 9 Needle insertion depth: 6 cm Catheter type: closed end flexible Catheter size: 19 Gauge Catheter at skin depth: 11 cm Test dose: negative  Assessment Events: blood not aspirated, injection not painful, no injection resistance, negative IV test and no paresthesia  Additional Notes Discussed risk of headache, infection, bleeding, nerve injury and failed or incomplete block.  Patient voices understanding and wishes to proceed.  Epidural placed easily on first attempt.  No paresthesia.  Patient tolerated procedure well with no apparent complications.  Jasmine December, MDReason for block:procedure for pain

## 2013-10-01 NOTE — H&P (Signed)
Barbara Neal is a 27 y.o. female presenting for labor with SROM at 10pm.  Antepartum course complicated by A1DM with good control.  GBS negative.  Declined aneuploidy testing.  Comfortable with epidural.  Maternal Medical History:  Reason for admission: Rupture of membranes.   Contractions: Onset was 13-24 hours ago.   Frequency: regular.   Perceived severity is moderate.    Fetal activity: Perceived fetal activity is normal.   Last perceived fetal movement was within the past hour.    Prenatal complications: no prenatal complications Prenatal Complications - Diabetes: gestational. Diabetes is managed by diet.      OB History   Grav Para Term Preterm Abortions TAB SAB Ect Mult Living   2 1 1       1      Past Medical History  Diagnosis Date  . Hx of varicella   . Hyperemesis   . Gestational diabetes     diet controlled   Past Surgical History  Procedure Laterality Date  . Cholecystectomy    . Arm surgery     Family History: family history includes Diabetes in her paternal grandmother; Hypertension in her father. Social History:  reports that she has never smoked. She has never used smokeless tobacco. She reports that she does not drink alcohol or use illicit drugs.   Prenatal Transfer Tool  Maternal Diabetes: Yes:  Diabetes Type:  Diet controlled Genetic Screening: Declined Maternal Ultrasounds/Referrals: Normal Fetal Ultrasounds or other Referrals:  None Maternal Substance Abuse:  No Significant Maternal Medications:  None Significant Maternal Lab Results:  Lab values include: Group B Strep negative Other Comments:  None  ROS  Dilation: Lip/rim Effacement (%): 100;90 Station: 0 Exam by:: h stone rnc Blood pressure 127/76, pulse 85, temperature 98 F (36.7 C), temperature source Oral, resp. rate 18, height 5' 3.5" (1.613 m), weight 208 lb (94.348 kg), last menstrual period 12/24/2012, SpO2 99.00%. Maternal Exam:  Uterine Assessment: Contraction strength  is moderate.  Contraction frequency is regular.   Abdomen: Patient reports no abdominal tenderness. Fundal height is c/w dates.   Estimated fetal weight is 8,8.   Fetal presentation: vertex  Pelvis: adequate for delivery.      Physical Exam  Constitutional: She is oriented to person, place, and time. She appears well-developed and well-nourished.  GI: Soft. There is no rebound and no guarding.  Neurological: She is alert and oriented to person, place, and time.  Skin: Skin is warm and dry.  Psychiatric: She has a normal mood and affect. Her behavior is normal.    Prenatal labs: ABO, Rh: --/--/O POS, O POS (11/04 2357) Antibody: NEG (11/04 2357) Rubella: Immune (04/02 0000) RPR: NON REACTIVE (11/04 2357)  HBsAg: Negative (04/02 0000)  HIV: Non-reactive (04/02 0000)  GBS: Negative (09/30 0000)   Assessment/Plan: 27yo G2P1001 at [redacted]w[redacted]d with labor -Anticipate SVD soon -GBS negative -A1DM with good control -Epidural in place   Farheen Pfahler 10/01/2013, 8:14 AM

## 2013-10-01 NOTE — Anesthesia Postprocedure Evaluation (Signed)
Anesthesia Post Note  Patient: Barbara Neal  Procedure(s) Performed: * No procedures listed *  Anesthesia type: Epidural  Patient location: Mother/Baby  Post pain: Pain level controlled  Post assessment: Post-op Vital signs reviewed  Last Vitals:  Filed Vitals:   10/01/13 1529  BP: 112/66  Pulse: 97  Temp: 37.2 C  Resp: 18    Post vital signs: Reviewed  Level of consciousness:alert  Complications: No apparent anesthesia complications

## 2013-10-01 NOTE — Anesthesia Preprocedure Evaluation (Signed)
Anesthesia Evaluation  Patient identified by MRN, date of birth, ID band Patient awake    Reviewed: Allergy & Precautions, H&P , NPO status , Patient's Chart, lab work & pertinent test results, reviewed documented beta blocker date and time   History of Anesthesia Complications Negative for: history of anesthetic complications  Airway Mallampati: II TM Distance: >3 FB Neck ROM: full    Dental  (+) Teeth Intact   Pulmonary neg pulmonary ROS,  breath sounds clear to auscultation        Cardiovascular negative cardio ROS  Rhythm:regular Rate:Normal     Neuro/Psych negative neurological ROS  negative psych ROS   GI/Hepatic negative GI ROS, Neg liver ROS,   Endo/Other  diabetes, GestationalMorbid obesity  Renal/GU negative Renal ROS     Musculoskeletal   Abdominal   Peds  Hematology negative hematology ROS (+)   Anesthesia Other Findings   Reproductive/Obstetrics (+) Pregnancy                           Anesthesia Physical Anesthesia Plan  ASA: III  Anesthesia Plan: Epidural   Post-op Pain Management:    Induction:   Airway Management Planned:   Additional Equipment:   Intra-op Plan:   Post-operative Plan:   Informed Consent: I have reviewed the patients History and Physical, chart, labs and discussed the procedure including the risks, benefits and alternatives for the proposed anesthesia with the patient or authorized representative who has indicated his/her understanding and acceptance.     Plan Discussed with:   Anesthesia Plan Comments:         Anesthesia Quick Evaluation

## 2013-10-01 NOTE — Lactation Note (Signed)
This note was copied from the chart of Barbara Catrice Zuleta. Lactation Consultation Note    Follow up consult with this mom of a NICU baby, in her hospital room.    I showed parents how to care for pump parts, and set premie setting and why, and how to  Hand express milk from mom's breasts,andthat this increases mom's milk supply over time. Mom is now 8 hours post partum, and already has red, tender nipples. I advised her to apply EBM, use good hand washing, and gave her comfort gels, with instruction in use. I fitted mom for a 24 nipple shield, and instructed mom in it;s use. I was able to apply shield, but it would not stay in place - no suction made. Mom has flat nipples, and I am guessing he is starting with a deep latch, but ends up on mom's nipples.  I told mom I wouldwork with her more tomorrow. Mom very tired, no sleep in 2 days.  Patient Name: Barbara Neal [id number] Date: 10/01/2013 Reason for consult: Follow-up assessment;NICU baby   Maternal Data    Feeding    LATCH Score/Interventions          Comfort (Breast/Nipple): Filling, red/small blisters or bruises, mild/mod discomfort  Problem noted: Mild/Moderate discomfort Interventions  (Cracked/bleeding/bruising/blister): Expressed breast milk to nipple Interventions (Mild/moderate discomfort): Comfort gels        Lactation Tools Discussed/Used Tools: Pump Breast pump type: Double-Electric Breast Pump WIC Program: No Pump Review: Setup, frequency, and cleaning;Milk Storage;Other (comment) (premie setting, handexpression)   Consult Status Consult Status: Follow-up Date: 10/02/13 Follow-up type: In-patient    Alfred Levins 10/01/2013, 6:05 PM

## 2013-10-02 LAB — CBC
HCT: 26.6 % — ABNORMAL LOW (ref 36.0–46.0)
Hemoglobin: 8.9 g/dL — ABNORMAL LOW (ref 12.0–15.0)
MCH: 28.2 pg (ref 26.0–34.0)
MCHC: 33.5 g/dL (ref 30.0–36.0)
Platelets: 184 10*3/uL (ref 150–400)
RBC: 3.16 MIL/uL — ABNORMAL LOW (ref 3.87–5.11)
RDW: 14.7 % (ref 11.5–15.5)

## 2013-10-02 MED ORDER — SERTRALINE HCL 25 MG PO TABS
25.0000 mg | ORAL_TABLET | Freq: Every day | ORAL | Status: DC
  Administered 2013-10-03: 25 mg via ORAL
  Filled 2013-10-02 (×3): qty 1

## 2013-10-02 NOTE — Lactation Note (Signed)
This note was copied from the chart of Barbara Herta Hink. Lactation Consultation Note     Follow up consult with this mom of a term NICU baby, in the NICU. Mom has been pumping every 3 hours, and not getting more than drops. On exam, her breast are soft and flat, and I was ntotble to hand express more than a tiny drop on the right. Mom was advised to keep pumping, since this was just 24 hour s post partum. The baby is getting mostly formula fed, but mom does put the baby to breast first for a few minutes. Her nipples still red and tender, and she is using the comfort  gels.  Patient Name: Barbara Neal [id number] Date: 10/02/2013     Maternal Data    Feeding Feeding Type: Formula Nipple Type: Slow - flow  LATCH Score/Interventions                      Lactation Tools Discussed/Used     Consult Status      Alfred Levins 10/02/2013, 4:33 PM

## 2013-10-02 NOTE — Progress Notes (Signed)
Post Partum Day 1 Subjective: up ad lib, voiding, tolerating PO and patient is v tearful, baby in NICU for glucose control  Objective: Blood pressure 116/77, pulse 64, temperature 97.8 F (36.6 C), temperature source Oral, resp. rate 16, height 5' 3.5" (1.613 m), weight 208 lb (94.348 kg), last menstrual period 12/24/2012, SpO2 99.00%, unknown if currently breastfeeding.  Physical Exam:  General: alert and cooperative Lochia: appropriate Uterine Fundus: firm Incision: perineum intact DVT Evaluation: No evidence of DVT seen on physical exam. Negative Homan's sign. No cords or calf tenderness. No significant calf/ankle edema.   Recent Labs  09/30/13 2357 10/02/13 0525  HGB 12.0 8.9*  HCT 35.7* 26.6*    Assessment/Plan: Plan for discharge tomorrow  Zoloft 25 mg   LOS: 2 days   Barbara Neal G 10/02/2013, 8:46 AM

## 2013-10-02 NOTE — Progress Notes (Deleted)
[facility]  NICU Attending Note    10/02/2013 3:53 PM    I have personally assessed this baby and have been physically present to direct the development and implementation of a plan of care.  Required care includes intensive cardiac and respiratory monitoring along with continuous or frequent vital sign monitoring, temperature support, adjustments to enteral and/or parenteral nutrition, and constant observation by the health care team under my supervision.  Barbara Neal is stable in open crib. His blood sugars are stable on IVF plus ad lib feedings. Weaning for blood sugars >55. Will wean more aggressively today.  Extremely faint hue discoloration on L testicle noted today. Non-tender, no hydrocoele. Does not appear consistent with torsion. Will follow closely.  I updated the parents in mom's room. Discussed plans regarding feeding and watching his scrotal color. _____________________ Electronically Signed By: Lucillie Garfinkel, MD

## 2013-10-03 MED ORDER — IBUPROFEN 600 MG PO TABS: 600.0000 mg | ORAL_TABLET | Freq: Four times a day (QID) | ORAL | Status: DC

## 2013-10-03 NOTE — Lactation Note (Signed)
This note was copied from the chart of Boy Lisamarie Coke. Lactation Consultation Note      Follow up brief consul with this mom of a now NICU baby, who is being discharged to home this evening with mom. Mom is also being discharged today. They are 51 hours post partum. Mom is pumping but not expressing any milk. The baby is being formula fed, but mom is trill doing some breastfeeding. She was not able to produce milk with her first baby . She  Did not want any assistance from me today, but knows to call lactation for questions/concerns, as needed.  Patient Name: Boy Aisia Correira [id number] Date: 10/03/2013 Reason for consult: Follow-up assessment;NICU baby   Maternal Data    Feeding Feeding Type: Formula Nipple Type: Slow - flow Length of feed: 20 min  LATCH Score/Interventions                      Lactation Tools Discussed/Used     Consult Status Consult Status: Complete Follow-up type: Call as needed    Alfred Levins 10/03/2013, 1:05 PM

## 2013-10-03 NOTE — Discharge Summary (Signed)
Obstetric Discharge Summary Reason for Admission: rupture of membranes Prenatal Procedures: ultrasound Intrapartum Procedures: spontaneous vaginal delivery Postpartum Procedures: none Complications-Operative and Postpartum: vaginal laceration Hemoglobin  Date Value Range Status  10/02/2013 8.9* 12.0 - 15.0 g/dL Final     REPEATED TO VERIFY     DELTA CHECK NOTED     HCT  Date Value Range Status  10/02/2013 26.6* 36.0 - 46.0 % Final    Physical Exam:  General: alert and cooperative Lochia: appropriate Uterine Fundus: firm Incision: healing well DVT Evaluation: No evidence of DVT seen on physical exam. Negative Homan's sign. No cords or calf tenderness. No significant calf/ankle edema.  Discharge Diagnoses: Term Pregnancy-delivered  Discharge Information: Date: 10/03/2013 Activity: pelvic rest Diet: routine Medications: PNV and Ibuprofen Condition: stable Instructions: refer to practice specific booklet Discharge to: home   Newborn Data: Live born female  Birth Weight: 8 lb 11.3 oz (3950 g) APGAR: 9, 9  Home with mother.  Malaky Tetrault G 10/03/2013, 8:40 AM

## 2013-10-03 NOTE — Progress Notes (Signed)
Post Partum Day 2 Subjective: no complaints, up ad lib, voiding, tolerating PO, + flatus and baby is off IV's and there has been discussion regarding baby discharge later today.patient reports mood has improved and does not need the Sertaline  Objective: Blood pressure 128/74, pulse 74, temperature 98.2 F (36.8 C), temperature source Oral, resp. rate 16, height 5' 3.5" (1.613 m), weight 208 lb (94.348 kg), last menstrual period 12/24/2012, SpO2 100.00%, unknown if currently breastfeeding.  Physical Exam:  General: alert and cooperative Lochia: appropriate Uterine Fundus: firm Incision: perineum intact DVT Evaluation: No evidence of DVT seen on physical exam. Negative Homan's sign. No cords or calf tenderness. No significant calf/ankle edema.   Recent Labs  09/30/13 2357 10/02/13 0525  HGB 12.0 8.9*  HCT 35.7* 26.6*    Assessment/Plan: Discharge home   LOS: 3 days   Anas Reister G 10/03/2013, 8:37 AM

## 2013-10-03 NOTE — Progress Notes (Signed)
Pt discharged to home with husband and baby down in NICU. Pt condition stable. No equipment needed for care at home.

## 2014-03-19 LAB — OB RESULTS CONSOLE HEPATITIS B SURFACE ANTIGEN: HEP B S AG: NEGATIVE

## 2014-03-19 LAB — OB RESULTS CONSOLE RPR: RPR: NONREACTIVE

## 2014-03-19 LAB — OB RESULTS CONSOLE ANTIBODY SCREEN: Antibody Screen: NEGATIVE

## 2014-03-19 LAB — OB RESULTS CONSOLE HIV ANTIBODY (ROUTINE TESTING): HIV: NONREACTIVE

## 2014-03-19 LAB — OB RESULTS CONSOLE RUBELLA ANTIBODY, IGM: Rubella: IMMUNE

## 2014-03-19 LAB — OB RESULTS CONSOLE ABO/RH: RH Type: POSITIVE

## 2014-03-19 LAB — OB RESULTS CONSOLE GC/CHLAMYDIA
Chlamydia: NEGATIVE
GC PROBE AMP, GENITAL: NEGATIVE

## 2014-09-16 ENCOUNTER — Inpatient Hospital Stay (HOSPITAL_COMMUNITY)
Admission: AD | Admit: 2014-09-16 | Discharge: 2014-09-17 | Disposition: A | Discharge status: Home / Self Care | Payer: Medicaid Other | Source: Ambulatory Visit | Attending: Obstetrics and Gynecology | Admitting: Obstetrics and Gynecology

## 2014-09-16 ENCOUNTER — Encounter (HOSPITAL_COMMUNITY): Payer: Self-pay | Admitting: *Deleted

## 2014-09-16 DIAGNOSIS — Z5320 Procedure and treatment not carried out because of patient's decision for unspecified reasons: Secondary | ICD-10-CM | POA: Insufficient documentation

## 2014-09-16 DIAGNOSIS — Z3A38 38 weeks gestation of pregnancy: Secondary | ICD-10-CM | POA: Insufficient documentation

## 2014-09-16 DIAGNOSIS — O471 False labor at or after 37 completed weeks of gestation: Secondary | ICD-10-CM | POA: Insufficient documentation

## 2014-09-16 NOTE — MAU Note (Signed)
Pt not in lobby when called

## 2014-09-16 NOTE — MAU Note (Signed)
Pt states she has been having contractions since 1400 today,pt states she was examine at 1300 and 4 cm/50/-2. Pt states she was instructed by the MD to come into the hospital wgeb sge started having contractions

## 2014-09-17 ENCOUNTER — Telehealth (HOSPITAL_COMMUNITY): Payer: Self-pay | Admitting: *Deleted

## 2014-09-17 ENCOUNTER — Encounter (HOSPITAL_COMMUNITY): Payer: Self-pay | Admitting: *Deleted

## 2014-09-17 DIAGNOSIS — Z3A38 38 weeks gestation of pregnancy: Secondary | ICD-10-CM | POA: Diagnosis not present

## 2014-09-17 DIAGNOSIS — O471 False labor at or after 37 completed weeks of gestation: Secondary | ICD-10-CM | POA: Diagnosis not present

## 2014-09-17 DIAGNOSIS — Z5320 Procedure and treatment not carried out because of patient's decision for unspecified reasons: Secondary | ICD-10-CM | POA: Diagnosis not present

## 2014-09-17 LAB — OB RESULTS CONSOLE GBS: GBS: NEGATIVE

## 2014-09-17 NOTE — Discharge Instructions (Signed)
Third Trimester of Pregnancy °The third trimester is from week 29 through week 42, months 7 through 9. The third trimester is a time when the fetus is growing rapidly. At the end of the ninth month, the fetus is about 20 inches in length and weighs 6-10 pounds.  °BODY CHANGES °Your body goes through many changes during pregnancy. The changes vary from woman to woman.  °· Your weight will continue to increase. You can expect to gain 25-35 pounds (11-16 kg) by the end of the pregnancy. °· You may begin to get stretch marks on your hips, abdomen, and breasts. °· You may urinate more often because the fetus is moving lower into your pelvis and pressing on your bladder. °· You may develop or continue to have heartburn as a result of your pregnancy. °· You may develop constipation because certain hormones are causing the muscles that push waste through your intestines to slow down. °· You may develop hemorrhoids or swollen, bulging veins (varicose veins). °· You may have pelvic pain because of the weight gain and pregnancy hormones relaxing your joints between the bones in your pelvis. Backaches may result from overexertion of the muscles supporting your posture. °· You may have changes in your hair. These can include thickening of your hair, rapid growth, and changes in texture. Some women also have hair loss during or after pregnancy, or hair that feels dry or thin. Your hair will most likely return to normal after your baby is born. °· Your breasts will continue to grow and be tender. A yellow discharge may leak from your breasts called colostrum. °· Your belly button may stick out. °· You may feel short of breath because of your expanding uterus. °· You may notice the fetus "dropping," or moving lower in your abdomen. °· You may have a bloody mucus discharge. This usually occurs a few days to a week before labor begins. °· Your cervix becomes thin and soft (effaced) near your due date. °WHAT TO EXPECT AT YOUR PRENATAL  EXAMS  °You will have prenatal exams every 2 weeks until week 36. Then, you will have weekly prenatal exams. During a routine prenatal visit: °· You will be weighed to make sure you and the fetus are growing normally. °· Your blood pressure is taken. °· Your abdomen will be measured to track your baby's growth. °· The fetal heartbeat will be listened to. °· Any test results from the previous visit will be discussed. °· You may have a cervical check near your due date to see if you have effaced. °At around 36 weeks, your caregiver will check your cervix. At the same time, your caregiver will also perform a test on the secretions of the vaginal tissue. This test is to determine if a type of bacteria, Group B streptococcus, is present. Your caregiver will explain this further. °Your caregiver may ask you: °· What your birth plan is. °· How you are feeling. °· If you are feeling the baby move. °· If you have had any abnormal symptoms, such as leaking fluid, bleeding, severe headaches, or abdominal cramping. °· If you have any questions. °Other tests or screenings that may be performed during your third trimester include: °· Blood tests that check for low iron levels (anemia). °· Fetal testing to check the health, activity level, and growth of the fetus. Testing is done if you have certain medical conditions or if there are problems during the pregnancy. °FALSE LABOR °You may feel small, irregular contractions that   eventually go away. These are called Braxton Hicks contractions, or false labor. Contractions may last for hours, days, or even weeks before true labor sets in. If contractions come at regular intervals, intensify, or become painful, it is best to be seen by your caregiver.  °SIGNS OF LABOR  °· Menstrual-like cramps. °· Contractions that are 5 minutes apart or less. °· Contractions that start on the top of the uterus and spread down to the lower abdomen and back. °· A sense of increased pelvic pressure or back  pain. °· A watery or bloody mucus discharge that comes from the vagina. °If you have any of these signs before the 37th week of pregnancy, call your caregiver right away. You need to go to the hospital to get checked immediately. °HOME CARE INSTRUCTIONS  °· Avoid all smoking, herbs, alcohol, and unprescribed drugs. These chemicals affect the formation and growth of the baby. °· Follow your caregiver's instructions regarding medicine use. There are medicines that are either safe or unsafe to take during pregnancy. °· Exercise only as directed by your caregiver. Experiencing uterine cramps is a good sign to stop exercising. °· Continue to eat regular, healthy meals. °· Wear a good support bra for breast tenderness. °· Do not use hot tubs, steam rooms, or saunas. °· Wear your seat belt at all times when driving. °· Avoid raw meat, uncooked cheese, cat litter boxes, and soil used by cats. These carry germs that can cause birth defects in the baby. °· Take your prenatal vitamins. °· Try taking a stool softener (if your caregiver approves) if you develop constipation. Eat more high-fiber foods, such as fresh vegetables or fruit and whole grains. Drink plenty of fluids to keep your urine clear or pale yellow. °· Take warm sitz baths to soothe any pain or discomfort caused by hemorrhoids. Use hemorrhoid cream if your caregiver approves. °· If you develop varicose veins, wear support hose. Elevate your feet for 15 minutes, 3-4 times a day. Limit salt in your diet. °· Avoid heavy lifting, wear low heal shoes, and practice good posture. °· Rest a lot with your legs elevated if you have leg cramps or low back pain. °· Visit your dentist if you have not gone during your pregnancy. Use a soft toothbrush to brush your teeth and be gentle when you floss. °· A sexual relationship may be continued unless your caregiver directs you otherwise. °· Do not travel far distances unless it is absolutely necessary and only with the approval  of your caregiver. °· Take prenatal classes to understand, practice, and ask questions about the labor and delivery. °· Make a trial run to the hospital. °· Pack your hospital bag. °· Prepare the baby's nursery. °· Continue to go to all your prenatal visits as directed by your caregiver. °SEEK MEDICAL CARE IF: °· You are unsure if you are in labor or if your water has broken. °· You have dizziness. °· You have mild pelvic cramps, pelvic pressure, or nagging pain in your abdominal area. °· You have persistent nausea, vomiting, or diarrhea. °· You have a bad smelling vaginal discharge. °· You have pain with urination. °SEEK IMMEDIATE MEDICAL CARE IF:  °· You have a fever. °· You are leaking fluid from your vagina. °· You have spotting or bleeding from your vagina. °· You have severe abdominal cramping or pain. °· You have rapid weight loss or gain. °· You have shortness of breath with chest pain. °· You notice sudden or extreme swelling   of your face, hands, ankles, feet, or legs. °· You have not felt your baby move in over an hour. °· You have severe headaches that do not go away with medicine. °· You have vision changes. °Document Released: 11/07/2001 Document Revised: 11/18/2013 Document Reviewed: 01/14/2013 °[employer]® Patient Information ©2015 [employer]. This information is not intended to replace advice given to you by your health care provider. Make sure you discuss any questions you have with your health care provider. °Fetal Movement Counts °Patient Name: __________________________________________________ Patient Due Date: ____________________ °Performing a fetal movement count is highly recommended in high-risk pregnancies, but it is good for every pregnant woman to do. Your health care provider may ask you to start counting fetal movements at 28 weeks of the pregnancy. Fetal movements often increase: °· After eating a full meal. °· After physical activity. °· After eating or drinking something sweet or  cold. °· At rest. °Pay attention to when you feel the baby is most active. This will help you notice a pattern of your baby's sleep and wake cycles and what factors contribute to an increase in fetal movement. It is important to perform a fetal movement count at the same time each day when your baby is normally most active.  °HOW TO COUNT FETAL MOVEMENTS °1. Find a quiet and comfortable area to sit or lie down on your left side. Lying on your left side provides the best blood and oxygen circulation to your baby. °2. Write down the day and time on a sheet of paper or in a journal. °3. Start counting kicks, flutters, swishes, rolls, or jabs in a 2-hour period. You should feel at least 10 movements within 2 hours. °4. If you do not feel 10 movements in 2 hours, wait 2-3 hours and count again. Look for a change in the pattern or not enough counts in 2 hours. °SEEK MEDICAL CARE IF: °· You feel less than 10 counts in 2 hours, tried twice. °· There is no movement in over an hour. °· The pattern is changing or taking longer each day to reach 10 counts in 2 hours. °· You feel the baby is not moving as he or she usually does. °Date: ____________ Movements: ____________ Start time: ____________ Finish time: ____________  °Date: ____________ Movements: ____________ Start time: ____________ Finish time: ____________ °Date: ____________ Movements: ____________ Start time: ____________ Finish time: ____________ °Date: ____________ Movements: ____________ Start time: ____________ Finish time: ____________ °Date: ____________ Movements: ____________ Start time: ____________ Finish time: ____________ °Date: ____________ Movements: ____________ Start time: ____________ Finish time: ____________ °Date: ____________ Movements: ____________ Start time: ____________ Finish time: ____________ °Date: ____________ Movements: ____________ Start time: ____________ Finish time: ____________  °Date: ____________ Movements: ____________ Start  time: ____________ Finish time: ____________ °Date: ____________ Movements: ____________ Start time: ____________ Finish time: ____________ °Date: ____________ Movements: ____________ Start time: ____________ Finish time: ____________ °Date: ____________ Movements: ____________ Start time: ____________ Finish time: ____________ °Date: ____________ Movements: ____________ Start time: ____________ Finish time: ____________ °Date: ____________ Movements: ____________ Start time: ____________ Finish time: ____________ °Date: ____________ Movements: ____________ Start time: ____________ Finish time: ____________  °Date: ____________ Movements: ____________ Start time: ____________ Finish time: ____________ °Date: ____________ Movements: ____________ Start time: ____________ Finish time: ____________ °Date: ____________ Movements: ____________ Start time: ____________ Finish time: ____________ °Date: ____________ Movements: ____________ Start time: ____________ Finish time: ____________ °Date: ____________ Movements: ____________ Start time: ____________ Finish time: ____________ °Date: ____________ Movements: ____________ Start time: ____________ Finish time: ____________ °Date: ____________ Movements: ____________ Start time: ____________ Finish time:   ____________  °Date: ____________ Movements: ____________ Start time: ____________ Finish time: ____________ °Date: ____________ Movements: ____________ Start time: ____________ Finish time: ____________ °Date: ____________ Movements: ____________ Start time: ____________ Finish time: ____________ °Date: ____________ Movements: ____________ Start time: ____________ Finish time: ____________ °Date: ____________ Movements: ____________ Start time: ____________ Finish time: ____________ °Date: ____________ Movements: ____________ Start time: ____________ Finish time: ____________ °Date: ____________ Movements: ____________ Start time: ____________ Finish time: ____________    °Date: ____________ Movements: ____________ Start time: ____________ Finish time: ____________ °Date: ____________ Movements: ____________ Start time: ____________ Finish time: ____________ °Date: ____________ Movements: ____________ Start time: ____________ Finish time: ____________ °Date: ____________ Movements: ____________ Start time: ____________ Finish time: ____________ °Date: ____________ Movements: ____________ Start time: ____________ Finish time: ____________ °Date: ____________ Movements: ____________ Start time: ____________ Finish time: ____________ °Date: ____________ Movements: ____________ Start time: ____________ Finish time: ____________  °Date: ____________ Movements: ____________ Start time: ____________ Finish time: ____________ °Date: ____________ Movements: ____________ Start time: ____________ Finish time: ____________ °Date: ____________ Movements: ____________ Start time: ____________ Finish time: ____________ °Date: ____________ Movements: ____________ Start time: ____________ Finish time: ____________ °Date: ____________ Movements: ____________ Start time: ____________ Finish time: ____________ °Date: ____________ Movements: ____________ Start time: ____________ Finish time: ____________ °Date: ____________ Movements: ____________ Start time: ____________ Finish time: ____________  °Date: ____________ Movements: ____________ Start time: ____________ Finish time: ____________ °Date: ____________ Movements: ____________ Start time: ____________ Finish time: ____________ °Date: ____________ Movements: ____________ Start time: ____________ Finish time: ____________ °Date: ____________ Movements: ____________ Start time: ____________ Finish time: ____________ °Date: ____________ Movements: ____________ Start time: ____________ Finish time: ____________ °Date: ____________ Movements: ____________ Start time: ____________ Finish time: ____________ °Date: ____________ Movements: ____________  Start time: ____________ Finish time: ____________  °Date: ____________ Movements: ____________ Start time: ____________ Finish time: ____________ °Date: ____________ Movements: ____________ Start time: ____________ Finish time: ____________ °Date: ____________ Movements: ____________ Start time: ____________ Finish time: ____________ °Date: ____________ Movements: ____________ Start time: ____________ Finish time: ____________ °Date: ____________ Movements: ____________ Start time: ____________ Finish time: ____________ °Date: ____________ Movements: ____________ Start time: ____________ Finish time: ____________ °Document Released: 12/13/2006 Document Revised: 03/30/2014 Document Reviewed: 09/09/2012 °[employer]® Patient Information ©2015 [employer]. This information is not intended to replace advice given to you by your health care provider. Make sure you discuss any questions you have with your health care provider. ° °

## 2014-09-17 NOTE — Telephone Encounter (Signed)
Preadmission screen  

## 2014-09-23 ENCOUNTER — Inpatient Hospital Stay (HOSPITAL_COMMUNITY)
Admission: RE | Admit: 2014-09-23 | Discharge: 2014-09-25 | DRG: 767 | Disposition: A | Discharge status: Home / Self Care | Payer: Medicaid Other | Source: Ambulatory Visit | Attending: Obstetrics and Gynecology | Admitting: Obstetrics and Gynecology

## 2014-09-23 ENCOUNTER — Inpatient Hospital Stay (HOSPITAL_COMMUNITY): Payer: Medicaid Other | Admitting: Anesthesiology

## 2014-09-23 ENCOUNTER — Encounter (HOSPITAL_COMMUNITY): Payer: Self-pay

## 2014-09-23 ENCOUNTER — Encounter (HOSPITAL_COMMUNITY): Payer: Medicaid Other | Admitting: Anesthesiology

## 2014-09-23 DIAGNOSIS — Z8249 Family history of ischemic heart disease and other diseases of the circulatory system: Secondary | ICD-10-CM | POA: Diagnosis not present

## 2014-09-23 DIAGNOSIS — Z3A39 39 weeks gestation of pregnancy: Secondary | ICD-10-CM | POA: Diagnosis present

## 2014-09-23 DIAGNOSIS — Z833 Family history of diabetes mellitus: Secondary | ICD-10-CM | POA: Diagnosis not present

## 2014-09-23 DIAGNOSIS — O0943 Supervision of pregnancy with grand multiparity, third trimester: Secondary | ICD-10-CM | POA: Diagnosis not present

## 2014-09-23 DIAGNOSIS — O403XX0 Polyhydramnios, third trimester, not applicable or unspecified: Principal | ICD-10-CM | POA: Diagnosis present

## 2014-09-23 DIAGNOSIS — Z302 Encounter for sterilization: Secondary | ICD-10-CM | POA: Diagnosis not present

## 2014-09-23 DIAGNOSIS — Z3490 Encounter for supervision of normal pregnancy, unspecified, unspecified trimester: Secondary | ICD-10-CM

## 2014-09-23 LAB — CBC
HCT: 33.6 % — ABNORMAL LOW (ref 36.0–46.0)
Hemoglobin: 10.9 g/dL — ABNORMAL LOW (ref 12.0–15.0)
MCH: 26.5 pg (ref 26.0–34.0)
MCHC: 32.4 g/dL (ref 30.0–36.0)
MCV: 81.6 fL (ref 78.0–100.0)
Platelets: 212 10*3/uL (ref 150–400)
RBC: 4.12 MIL/uL (ref 3.87–5.11)
RDW: 16.2 % — ABNORMAL HIGH (ref 11.5–15.5)
WBC: 9.6 10*3/uL (ref 4.0–10.5)

## 2014-09-23 LAB — SURGICAL PCR SCREEN
MRSA, PCR: NEGATIVE
STAPHYLOCOCCUS AUREUS: NEGATIVE

## 2014-09-23 LAB — TYPE AND SCREEN
ABO/RH(D): O POS
Antibody Screen: NEGATIVE

## 2014-09-23 LAB — RPR

## 2014-09-23 MED ORDER — LIDOCAINE HCL (PF) 1 % IJ SOLN
INTRAMUSCULAR | Status: DC | PRN
  Administered 2014-09-23 (×2): 4 mL

## 2014-09-23 MED ORDER — OXYTOCIN 40 UNITS IN LACTATED RINGERS INFUSION - SIMPLE MED
62.5000 mL/h | INTRAVENOUS | Status: DC
  Administered 2014-09-23: 62.5 mL/h via INTRAVENOUS

## 2014-09-23 MED ORDER — DIPHENHYDRAMINE HCL 25 MG PO CAPS
25.0000 mg | ORAL_CAPSULE | Freq: Four times a day (QID) | ORAL | Status: DC | PRN
Start: 2014-09-23 — End: 2014-09-25

## 2014-09-23 MED ORDER — METOCLOPRAMIDE HCL 10 MG PO TABS
10.0000 mg | ORAL_TABLET | Freq: Once | ORAL | Status: AC
  Administered 2014-09-24: 10 mg via ORAL
  Filled 2014-09-23: qty 1

## 2014-09-23 MED ORDER — PHENYLEPHRINE 40 MCG/ML (10ML) SYRINGE FOR IV PUSH (FOR BLOOD PRESSURE SUPPORT)
80.0000 ug | PREFILLED_SYRINGE | INTRAVENOUS | Status: DC | PRN
  Filled 2014-09-23: qty 2

## 2014-09-23 MED ORDER — OXYCODONE-ACETAMINOPHEN 5-325 MG PO TABS: 2.0000 | ORAL_TABLET | ORAL | Status: DC | PRN

## 2014-09-23 MED ORDER — EPHEDRINE 5 MG/ML INJ
10.0000 mg | INTRAVENOUS | Status: DC | PRN
  Filled 2014-09-23: qty 2

## 2014-09-23 MED ORDER — ACETAMINOPHEN 325 MG PO TABS: 650.0000 mg | ORAL_TABLET | ORAL | Status: DC | PRN

## 2014-09-23 MED ORDER — FENTANYL 2.5 MCG/ML BUPIVACAINE 1/10 % EPIDURAL INFUSION (WH - ANES)
INTRAMUSCULAR | Status: AC
  Administered 2014-09-23: 14 mL/h via EPIDURAL
  Filled 2014-09-23: qty 125

## 2014-09-23 MED ORDER — ONDANSETRON HCL 4 MG/2ML IJ SOLN: 4.0000 mg | Freq: Four times a day (QID) | INTRAMUSCULAR | Status: DC | PRN

## 2014-09-23 MED ORDER — BENZOCAINE-MENTHOL 20-0.5 % EX AERO: 1.0000 "application " | INHALATION_SPRAY | CUTANEOUS | Status: DC | PRN

## 2014-09-23 MED ORDER — ONDANSETRON HCL 4 MG PO TABS: 4.0000 mg | ORAL_TABLET | ORAL | Status: DC | PRN

## 2014-09-23 MED ORDER — LACTATED RINGERS IV SOLN
500.0000 mL | Freq: Once | INTRAVENOUS | Status: AC
  Administered 2014-09-23: 500 mL via INTRAVENOUS

## 2014-09-23 MED ORDER — DIBUCAINE 1 % RE OINT: 1.0000 "application " | TOPICAL_OINTMENT | RECTAL | Status: DC | PRN

## 2014-09-23 MED ORDER — FENTANYL 2.5 MCG/ML BUPIVACAINE 1/10 % EPIDURAL INFUSION (WH - ANES)
14.0000 mL/h | INTRAMUSCULAR | Status: DC | PRN
  Administered 2014-09-23: 14 mL/h via EPIDURAL

## 2014-09-23 MED ORDER — OXYCODONE-ACETAMINOPHEN 5-325 MG PO TABS
1.0000 | ORAL_TABLET | ORAL | Status: DC | PRN
  Administered 2014-09-24 – 2014-09-25 (×4): 1 via ORAL
  Filled 2014-09-23 (×4): qty 1

## 2014-09-23 MED ORDER — WITCH HAZEL-GLYCERIN EX PADS: 1.0000 "application " | MEDICATED_PAD | CUTANEOUS | Status: DC | PRN

## 2014-09-23 MED ORDER — TETANUS-DIPHTH-ACELL PERTUSSIS 5-2.5-18.5 LF-MCG/0.5 IM SUSP: 0.5000 mL | Freq: Once | INTRAMUSCULAR | Status: DC

## 2014-09-23 MED ORDER — LIDOCAINE HCL (PF) 1 % IJ SOLN
30.0000 mL | INTRAMUSCULAR | Status: DC | PRN
  Filled 2014-09-23: qty 30

## 2014-09-23 MED ORDER — FAMOTIDINE 20 MG PO TABS
40.0000 mg | ORAL_TABLET | Freq: Once | ORAL | Status: AC
  Administered 2014-09-24: 40 mg via ORAL
  Filled 2014-09-23: qty 2

## 2014-09-23 MED ORDER — LACTATED RINGERS IV SOLN
INTRAVENOUS | Status: DC
  Administered 2014-09-24 (×2): via INTRAVENOUS

## 2014-09-23 MED ORDER — SIMETHICONE 80 MG PO CHEW: 80.0000 mg | CHEWABLE_TABLET | ORAL | Status: DC | PRN

## 2014-09-23 MED ORDER — CITRIC ACID-SODIUM CITRATE 334-500 MG/5ML PO SOLN: 30.0000 mL | ORAL | Status: DC | PRN

## 2014-09-23 MED ORDER — MEASLES, MUMPS & RUBELLA VAC ~~LOC~~ INJ
0.5000 mL | INJECTION | Freq: Once | SUBCUTANEOUS | Status: DC
  Filled 2014-09-23: qty 0.5

## 2014-09-23 MED ORDER — IBUPROFEN 600 MG PO TABS
600.0000 mg | ORAL_TABLET | Freq: Four times a day (QID) | ORAL | Status: DC
  Administered 2014-09-23 – 2014-09-25 (×6): 600 mg via ORAL
  Filled 2014-09-23 (×6): qty 1

## 2014-09-23 MED ORDER — MEDROXYPROGESTERONE ACETATE 150 MG/ML IM SUSP: 150.0000 mg | INTRAMUSCULAR | Status: DC | PRN

## 2014-09-23 MED ORDER — OXYCODONE-ACETAMINOPHEN 5-325 MG PO TABS: 1.0000 | ORAL_TABLET | ORAL | Status: DC | PRN

## 2014-09-23 MED ORDER — LACTATED RINGERS IV SOLN
INTRAVENOUS | Status: DC
  Administered 2014-09-23 (×2): via INTRAVENOUS

## 2014-09-23 MED ORDER — SENNOSIDES-DOCUSATE SODIUM 8.6-50 MG PO TABS
2.0000 | ORAL_TABLET | ORAL | Status: DC
  Administered 2014-09-23 – 2014-09-25 (×2): 2 via ORAL
  Filled 2014-09-23 (×2): qty 2

## 2014-09-23 MED ORDER — TERBUTALINE SULFATE 1 MG/ML IJ SOLN: 0.2500 mg | Freq: Once | INTRAMUSCULAR | Status: DC | PRN

## 2014-09-23 MED ORDER — LANOLIN HYDROUS EX OINT: TOPICAL_OINTMENT | CUTANEOUS | Status: DC | PRN

## 2014-09-23 MED ORDER — PHENYLEPHRINE 40 MCG/ML (10ML) SYRINGE FOR IV PUSH (FOR BLOOD PRESSURE SUPPORT)
PREFILLED_SYRINGE | INTRAVENOUS | Status: AC
  Filled 2014-09-23: qty 10

## 2014-09-23 MED ORDER — LACTATED RINGERS IV SOLN
500.0000 mL | INTRAVENOUS | Status: DC | PRN
  Administered 2014-09-23: 500 mL via INTRAVENOUS

## 2014-09-23 MED ORDER — PRENATAL MULTIVITAMIN CH: 1.0000 | ORAL_TABLET | Freq: Every day | ORAL | Status: DC

## 2014-09-23 MED ORDER — FENTANYL 2.5 MCG/ML BUPIVACAINE 1/10 % EPIDURAL INFUSION (WH - ANES)
INTRAMUSCULAR | Status: DC | PRN
  Administered 2014-09-23: 14 mL/h via EPIDURAL

## 2014-09-23 MED ORDER — OXYTOCIN 40 UNITS IN LACTATED RINGERS INFUSION - SIMPLE MED
1.0000 m[IU]/min | INTRAVENOUS | Status: DC
  Administered 2014-09-23: 2 m[IU]/min via INTRAVENOUS
  Filled 2014-09-23: qty 1000

## 2014-09-23 MED ORDER — ONDANSETRON HCL 4 MG/2ML IJ SOLN: 4.0000 mg | INTRAMUSCULAR | Status: DC | PRN

## 2014-09-23 MED ORDER — DIPHENHYDRAMINE HCL 50 MG/ML IJ SOLN: 12.5000 mg | INTRAMUSCULAR | Status: DC | PRN

## 2014-09-23 MED ORDER — OXYTOCIN BOLUS FROM INFUSION: 500.0000 mL | INTRAVENOUS | Status: DC

## 2014-09-23 NOTE — Anesthesia Preprocedure Evaluation (Addendum)
Anesthesia Evaluation  Patient identified by MRN, date of birth, ID band Patient awake    Reviewed: Allergy & Precautions, H&P , Patient's Chart, lab work & pertinent test results  Airway Mallampati: III  TM Distance: >3 FB Neck ROM: Full    Dental no notable dental hx. (+) Teeth Intact   Pulmonary neg pulmonary ROS,  breath sounds clear to auscultation  Pulmonary exam normal       Cardiovascular negative cardio ROS  Rhythm:Regular Rate:Normal     Neuro/Psych negative neurological ROS  negative psych ROS   GI/Hepatic Neg liver ROS, Hyperemesis   Endo/Other  Obesity  Renal/GU negative Renal ROS  negative genitourinary   Musculoskeletal negative musculoskeletal ROS (+)   Abdominal (+) + obese,   Peds  Hematology  (+) anemia ,   Anesthesia Other Findings   Reproductive/Obstetrics (+) Pregnancy                            Anesthesia Physical Anesthesia Plan  ASA: II  Anesthesia Plan: Epidural   Post-op Pain Management:    Induction:   Airway Management Planned: Natural Airway  Additional Equipment:   Intra-op Plan:   Post-operative Plan:   Informed Consent: I have reviewed the patients History and Physical, chart, labs and discussed the procedure including the risks, benefits and alternatives for the proposed anesthesia with the patient or authorized representative who has indicated his/her understanding and acceptance.     Plan Discussed with: Anesthesiologist  Anesthesia Plan Comments:         Anesthesia Quick Evaluation

## 2014-09-23 NOTE — Progress Notes (Signed)
SVD of vigorous female infant w/ apgars of 9,9.  Placenta delivered spontaneous w/ 3VC.   No lacerations.  Fundus firm.  EBL 300cc .

## 2014-09-23 NOTE — Progress Notes (Signed)
Pt without complaints AROM - clear FHT - cat 1  A/P:  IOL

## 2014-09-23 NOTE — H&P (Addendum)
Barbara SantosKristen F Neal is a 28 y.o. female presenting for IOL. previous pregnancy complicted by well controlled A1DM, normal glucose this pregnancy.  Not feeling ctx.  No vb or lof.  Last US with evidence of polyhydramnios  History OB History   Grav Para Term Preterm Abortions TAB SAB Ect Mult Living   3 2 2       2      Past Medical History  Diagnosis Date  . Hx of varicella   . Hyperemesis    Past Surgical History  Procedure Laterality Date  . Cholecystectomy    . Arm surgery     Family History: family history includes Diabetes in her paternal grandmother; Hypertension in her father. Social History:  reports that she has never smoked. She has never used smokeless tobacco. She reports that she does not drink alcohol or use illicit drugs.   Prenatal Transfer Tool  Maternal Diabetes: Yes:  Diabetes Type:  Diet controlled Genetic Screening: Normal Maternal Ultrasounds/Referrals: Normal Fetal Ultrasounds or other Referrals:  None Maternal Substance Abuse:  No Significant Maternal Medications:  None Significant Maternal Lab Results:  None Other Comments:  None  ROS    Height 5\' 4"  (1.626 m), weight 215 lb (97.523 kg), unknown if currently breastfeeding. Exam Physical Exam  Gen - NAD Abd - gravid, NT Ext - trace edema bilaterally Cvx 2-3cm Prenatal labs: ABO, Rh: O/Positive/-- (04/23 0000) Antibody: Negative (04/23 0000) Rubella: Immune (04/23 0000) RPR: Nonreactive (04/23 0000)  HBsAg: Negative (04/23 0000)  HIV: Non-reactive (04/23 0000)  GBS: Negative (10/22 0000)   Assessment/Plan: Admit Pitocin/AROM   [name] 09/23/2014, 8:22 AM

## 2014-09-23 NOTE — Anesthesia Procedure Notes (Signed)
Epidural Patient location during procedure: OB Start time: 09/23/2014 10:55 AM  Staffing Anesthesiologist: Nonie Lochner A. Performed by: anesthesiologist   Preanesthetic Checklist Completed: patient identified, site marked, surgical consent, pre-op evaluation, timeout performed, IV checked, risks and benefits discussed and monitors and equipment checked  Epidural Patient position: sitting Prep: site prepped and draped and DuraPrep Patient monitoring: continuous pulse ox and blood pressure Approach: midline Location: L3-L4 Injection technique: LOR air  Needle:  Needle type: Tuohy  Needle gauge: 17 G Needle length: 9 cm and 9 Needle insertion depth: 6 cm Catheter type: closed end flexible Catheter size: 19 Gauge Catheter at skin depth: 11 cm Test dose: negative and Other  Assessment Events: blood not aspirated, injection not painful, no injection resistance, negative IV test and no paresthesia  Additional Notes Patient identified. Risks and benefits discussed including failed block, incomplete  Pain control, post dural puncture headache, nerve damage, paralysis, blood pressure Changes, nausea, vomiting, reactions to medications-both toxic and allergic and post Partum back pain. All questions were answered. Patient expressed understanding and wished to proceed. Sterile technique was used throughout procedure. Epidural site was Dressed with sterile barrier dressing. No paresthesias, signs of intravascular injection Or signs of intrathecal spread were encountered.  Patient was more comfortable after the epidural was dosed. Please see RN's note for documentation of vital signs and FHR which are stable.

## 2014-09-24 ENCOUNTER — Encounter (HOSPITAL_COMMUNITY): Payer: Medicaid Other | Admitting: Anesthesiology

## 2014-09-24 ENCOUNTER — Inpatient Hospital Stay (HOSPITAL_COMMUNITY): Payer: Medicaid Other | Admitting: Anesthesiology

## 2014-09-24 ENCOUNTER — Encounter (HOSPITAL_COMMUNITY)
Admission: RE | Disposition: A | Discharge status: Home / Self Care | Payer: Self-pay | Source: Ambulatory Visit | Attending: Obstetrics and Gynecology

## 2014-09-24 HISTORY — PX: TUBAL LIGATION: SHX77

## 2014-09-24 LAB — CBC
HEMATOCRIT: 29.4 % — AB (ref 36.0–46.0)
HEMOGLOBIN: 9.5 g/dL — AB (ref 12.0–15.0)
MCH: 26.7 pg (ref 26.0–34.0)
MCHC: 32.3 g/dL (ref 30.0–36.0)
MCV: 82.6 fL (ref 78.0–100.0)
Platelets: 193 10*3/uL (ref 150–400)
RBC: 3.56 MIL/uL — ABNORMAL LOW (ref 3.87–5.11)
RDW: 16.6 % — ABNORMAL HIGH (ref 11.5–15.5)
WBC: 11 10*3/uL — AB (ref 4.0–10.5)

## 2014-09-24 SURGERY — LIGATION, FALLOPIAN TUBE, POSTPARTUM
Anesthesia: Epidural | Site: Abdomen | Laterality: Bilateral

## 2014-09-24 MED ORDER — FENTANYL CITRATE 0.05 MG/ML IJ SOLN
INTRAMUSCULAR | Status: DC | PRN
  Administered 2014-09-24 (×2): 50 ug via INTRAVENOUS
  Administered 2014-09-24 (×2): 25 ug via INTRAVENOUS
  Administered 2014-09-24: 50 ug via INTRAVENOUS

## 2014-09-24 MED ORDER — MEPERIDINE HCL 25 MG/ML IJ SOLN: 6.2500 mg | INTRAMUSCULAR | Status: DC | PRN

## 2014-09-24 MED ORDER — DEXAMETHASONE SODIUM PHOSPHATE 4 MG/ML IJ SOLN
INTRAMUSCULAR | Status: AC
  Filled 2014-09-24: qty 1

## 2014-09-24 MED ORDER — FENTANYL CITRATE 0.05 MG/ML IJ SOLN: 25.0000 ug | INTRAMUSCULAR | Status: DC | PRN

## 2014-09-24 MED ORDER — MIDAZOLAM HCL 5 MG/5ML IJ SOLN
INTRAMUSCULAR | Status: DC | PRN
  Administered 2014-09-24: 1 mg via INTRAVENOUS

## 2014-09-24 MED ORDER — KETOROLAC TROMETHAMINE 30 MG/ML IJ SOLN
INTRAMUSCULAR | Status: DC | PRN
  Administered 2014-09-24: 30 mg via INTRAVENOUS

## 2014-09-24 MED ORDER — SODIUM BICARBONATE 8.4 % IV SOLN
INTRAVENOUS | Status: DC | PRN
  Administered 2014-09-24 (×4): 5 mL via EPIDURAL

## 2014-09-24 MED ORDER — OXYCODONE HCL 5 MG PO TABS: 5.0000 mg | ORAL_TABLET | Freq: Once | ORAL | Status: AC | PRN

## 2014-09-24 MED ORDER — ONDANSETRON HCL 4 MG/2ML IJ SOLN
INTRAMUSCULAR | Status: DC | PRN
  Administered 2014-09-24: 4 mg via INTRAVENOUS

## 2014-09-24 MED ORDER — DEXAMETHASONE SODIUM PHOSPHATE 4 MG/ML IJ SOLN
INTRAMUSCULAR | Status: DC | PRN
  Administered 2014-09-24: 4 mg via INTRAVENOUS

## 2014-09-24 MED ORDER — MIDAZOLAM HCL 2 MG/2ML IJ SOLN
INTRAMUSCULAR | Status: AC
  Filled 2014-09-24: qty 2

## 2014-09-24 MED ORDER — LIDOCAINE-EPINEPHRINE (PF) 2 %-1:200000 IJ SOLN
INTRAMUSCULAR | Status: AC
  Filled 2014-09-24: qty 20

## 2014-09-24 MED ORDER — SODIUM BICARBONATE 8.4 % IV SOLN
INTRAVENOUS | Status: AC
  Filled 2014-09-24: qty 50

## 2014-09-24 MED ORDER — FENTANYL CITRATE 0.05 MG/ML IJ SOLN
INTRAMUSCULAR | Status: AC
  Filled 2014-09-24: qty 5

## 2014-09-24 MED ORDER — OXYCODONE HCL 5 MG/5ML PO SOLN: 5.0000 mg | Freq: Once | ORAL | Status: AC | PRN

## 2014-09-24 MED ORDER — METOCLOPRAMIDE HCL 5 MG/ML IJ SOLN: 10.0000 mg | Freq: Once | INTRAMUSCULAR | Status: AC | PRN

## 2014-09-24 SURGICAL SUPPLY — 21 items
CHLORAPREP W/TINT 26ML (MISCELLANEOUS) ×6 IMPLANT
CLOTH BEACON ORANGE TIMEOUT ST (SAFETY) ×3 IMPLANT
CONTAINER PREFILL 10% NBF 15ML (MISCELLANEOUS) ×6 IMPLANT
DRSG OPSITE POSTOP 3X4 (GAUZE/BANDAGES/DRESSINGS) ×3 IMPLANT
GLOVE BIO SURGEON STRL SZ 6 (GLOVE) ×3 IMPLANT
GLOVE BIOGEL PI IND STRL 6 (GLOVE) ×2 IMPLANT
GLOVE BIOGEL PI INDICATOR 6 (GLOVE) ×4
GOWN STRL REUS W/TWL LRG LVL3 (GOWN DISPOSABLE) ×6 IMPLANT
NEEDLE HYPO 25X1 1.5 SAFETY (NEEDLE) IMPLANT
NS IRRIG 1000ML POUR BTL (IV SOLUTION) ×3 IMPLANT
PACK ABDOMINAL MINOR (CUSTOM PROCEDURE TRAY) ×3 IMPLANT
SPONGE LAP 4X18 X RAY DECT (DISPOSABLE) IMPLANT
SUT GUT PLAIN 0 CT-3 TAN 27 (SUTURE) IMPLANT
SUT PLAIN 0 NONE (SUTURE) ×3 IMPLANT
SUT VIC AB 0 CT2 27 (SUTURE) ×3 IMPLANT
SUT VIC AB 3-0 PS2 18 (SUTURE) ×2
SUT VIC AB 3-0 PS2 18XBRD (SUTURE) ×1 IMPLANT
SYR CONTROL 10ML LL (SYRINGE) IMPLANT
TOWEL OR 17X24 6PK STRL BLUE (TOWEL DISPOSABLE) ×6 IMPLANT
TRAY FOLEY CATH 14FR (SET/KITS/TRAYS/PACK) ×3 IMPLANT
WATER STERILE IRR 1000ML POUR (IV SOLUTION) ×3 IMPLANT

## 2014-09-24 NOTE — Anesthesia Postprocedure Evaluation (Signed)
  Anesthesia Post-op Note  Patient: Barbara Neal  Procedure(s) Performed: * No procedures listed *  Patient Location: Mother/Baby  Anesthesia Type:Epidural  Level of Consciousness: awake, alert , oriented and patient cooperative  Airway and Oxygen Therapy: Patient Spontanous Breathing  Post-op Pain: mild  Post-op Assessment: Post-op Vital signs reviewed, Patient's Cardiovascular Status Stable, Respiratory Function Stable, Patent Airway, No headache, No backache, No residual numbness and No residual motor weakness  Post-op Vital Signs: Reviewed and stable  Last Vitals:  Filed Vitals:   09/24/14 0539  BP: 139/85  Pulse: 80  Temp: 36.6 C  Resp: 20    Complications: No apparent anesthesia complications

## 2014-09-24 NOTE — Anesthesia Preprocedure Evaluation (Signed)
Anesthesia Evaluation  Patient identified by MRN, date of birth, ID band Patient awake    Reviewed: Allergy & Precautions, H&P , Patient's Chart, lab work & pertinent test results  Airway Mallampati: III  TM Distance: >3 FB Neck ROM: Full    Dental no notable dental hx. (+) Teeth Intact   Pulmonary neg pulmonary ROS,  breath sounds clear to auscultation  Pulmonary exam normal       Cardiovascular negative cardio ROS  Rhythm:Regular Rate:Normal     Neuro/Psych negative neurological ROS  negative psych ROS   GI/Hepatic Neg liver ROS, Hyperemesis   Endo/Other  Obesity  Renal/GU negative Renal ROS  negative genitourinary   Musculoskeletal negative musculoskeletal ROS (+)   Abdominal (+) + obese,   Peds  Hematology  (+) anemia ,   Anesthesia Other Findings   Reproductive/Obstetrics Desires Sterilization                             Anesthesia Physical  Anesthesia Plan  ASA: II  Anesthesia Plan: Epidural   Post-op Pain Management:    Induction:   Airway Management Planned: Natural Airway  Additional Equipment:   Intra-op Plan:   Post-operative Plan:   Informed Consent: I have reviewed the patients History and Physical, chart, labs and discussed the procedure including the risks, benefits and alternatives for the proposed anesthesia with the patient or authorized representative who has indicated his/her understanding and acceptance.     Plan Discussed with: Anesthesiologist  Anesthesia Plan Comments:         Anesthesia Quick Evaluation

## 2014-09-24 NOTE — Addendum Note (Signed)
Addendum created 09/24/14 1516 by Elbert Ewingsolleen S Craig Wisnewski, CRNA   Modules edited: Anesthesia Medication Administration

## 2014-09-24 NOTE — Anesthesia Postprocedure Evaluation (Signed)
  Anesthesia Post-op Note  Patient: Barbara SantosKristen F [place]  Procedure(s) Performed: Procedure(s): POST PARTUM TUBAL LIGATION (Bilateral)  Patient is awake, responsive, moving her legs, and has signs of resolution of her numbness. Pain and nausea are reasonably well controlled. Vital signs are stable and clinically acceptable. Oxygen saturation is clinically acceptable. There are no apparent anesthetic complications at this time. Patient is ready for discharge.

## 2014-09-24 NOTE — Transfer of Care (Signed)
Immediate Anesthesia Transfer of Care Note  Patient: Jerl SantosKristen F [place]  Procedure(s) Performed: Procedure(s): POST PARTUM TUBAL LIGATION (Bilateral)  Patient Location: PACU  Anesthesia Type:Epidural  Level of Consciousness: awake, alert  and oriented  Airway & Oxygen Therapy: Patient Spontanous Breathing  Post-op Assessment: Report given to PACU RN and Post -op Vital signs reviewed and stable  Post vital signs: Reviewed and stable  Complications: No apparent anesthesia complications

## 2014-09-24 NOTE — Op Note (Signed)
Barbara SantosKristen F Neal 09/23/2014 - 09/24/2014  PREOPERATIVE DIAGNOSIS:  Multiparity, undesired fertility  POSTOPERATIVE DIAGNOSIS:  Multiparity, undesired fertility  PROCEDURE:  Postpartum Bilateral Tubal Sterilization via modified [place]   ANESTHESIA:  Epidural  COMPLICATIONS:  None immediate.  ESTIMATED BLOOD LOSS:  20 ml..  INDICATIONS: 28 y.o. [id number]  with undesired fertility,status post vaginal delivery, desires permanent sterilization.  Other reversible forms of contraception were discussed with patient; she declines all other modalities. Risks of procedure discussed with patient including but not limited to: risk of regret, permanence of method, bleeding, infection, injury to surrounding organs and need for additional procedures.  Failure risk of 0.5-1% with increased risk of ectopic gestation if pregnancy occurs was also discussed with patient.     FINDINGS:  Omental adhesion to the infraumbilical fascia.  Normal uterus, tubes, and ovaries.    PROCEDURE DETAILS: The patient was taken to the operating room where her epidural anesthesia was dosed up to surgical level and found to be adequate.  She was then placed in the dorsal supine position and prepped and draped in sterile fashion.  After an adequate timeout was performed, attention was turned to the patient's abdomen where a small umbilical skin incision was made. The incision was taken down to the layer of fascia using the scalpel, and fascia was incised, and extended bilaterally using Mayo scissors.  The peritoneum was entered in a sharp fashion.  Omental adhesion was noted and was released sharply to allow access to the fallopian tubes.  Attention was then turned to the patient's uterus, and left fallopian tube was identified and followed out to the fimbriated end.  A tubal segment was elevated approximately 3 cm from the cornual region, doubly tied with plain gut, and tubal segment excised with excellent hemostasis.  The remaining  fallopian tube was returned to the abdomen.  A similar process was carried out on the right side allowing for bilateral tubal sterilization.  Good hemostasis was noted overall.The instruments were then removed from the patient's abdomen and the fascial incision was repaired with 0 Vicryl, and the skin was closed with a 4-0 Vicryl subcuticular stitch.  The patient tolerated the procedure well.  Instrument, sponge, and needle counts were correct times two.  The patient was then taken to the recovery room awake and in stable condition.  Langston MaskerMORRIS, Barbara Hadsall 09/24/2014 2:31 PM

## 2014-09-24 NOTE — Progress Notes (Signed)
Patient counseled for BTL including risk of bleeding, infection, scarring and damage to surrounding structures.  She understands the risk of failure and regret.  All questions were answered and the patient wishes to proceed.   Mitchel HonourMegan Shriya Aker, DO

## 2014-09-24 NOTE — Progress Notes (Signed)
Post Partum Day 1 Subjective: no complaints, up ad lib, voiding and for pptl at 1pm today  Objective: Blood pressure 139/85, pulse 80, temperature 97.8 F (36.6 C), temperature source Oral, resp. rate 20, height 5\' 4"  (1.626 m), weight 215 lb (97.523 kg), SpO2 98.00%, unknown if currently breastfeeding.  Physical Exam:  General: alert and cooperative Lochia: appropriate Uterine Fundus: firm Incision: perineum intact DVT Evaluation: No evidence of DVT seen on physical exam. Negative Homan's sign. No cords or calf tenderness. No significant calf/ankle edema.   Recent Labs  09/23/14 0824 09/24/14 0600  HGB 10.9* 9.5*  HCT 33.6* 29.4*    Assessment/Plan: Plan for discharge tomorrow, plans outpatient circ   LOS: 1 day   Masayuki Sakai G 09/24/2014, 8:35 AM

## 2014-09-25 ENCOUNTER — Encounter (HOSPITAL_COMMUNITY): Payer: Self-pay | Admitting: Obstetrics & Gynecology

## 2014-09-25 MED ORDER — IBUPROFEN 600 MG PO TABS: 600.0000 mg | ORAL_TABLET | Freq: Four times a day (QID) | ORAL | Status: AC

## 2014-09-25 MED ORDER — OXYCODONE-ACETAMINOPHEN 5-325 MG PO TABS: 1.0000 | ORAL_TABLET | ORAL | Status: AC | PRN

## 2014-09-25 NOTE — Addendum Note (Signed)
Addendum created 09/25/14 [id number] by Algis GreenhouseLinda A Rosie Torrez, CRNA   Modules edited: Notes Section   Notes Section:  File: [id number]

## 2014-09-25 NOTE — Anesthesia Postprocedure Evaluation (Signed)
Anesthesia Post Note  Patient: Barbara SantosKristen F Dejarnette  Procedure(s) Performed: Procedure(s) (LRB): POST PARTUM TUBAL LIGATION (Bilateral)  Anesthesia type: Epidural  Patient location: Mother/Baby  Post pain: Pain level controlled  Post assessment: Post-op Vital signs reviewed  Last Vitals:  Filed Vitals:   09/25/14 0830  BP: 130/78  Pulse: 62  Temp: 36.4 C  Resp: 16    Post vital signs: Reviewed  Level of consciousness:alert  Complications: No apparent anesthesia complications

## 2014-09-25 NOTE — Discharge Summary (Signed)
Obstetric Discharge Summary Reason for Admission: induction of labor Prenatal Procedures: ultrasound Intrapartum Procedures: spontaneous vaginal delivery Postpartum Procedures: P.P. tubal ligation Complications-Operative and Postpartum: 2 degree perineal laceration Hemoglobin  Date Value Ref Range Status  09/24/2014 9.5* 12.0 - 15.0 g/dL Final     HCT  Date Value Ref Range Status  09/24/2014 29.4* 36.0 - 46.0 % Final    Physical Exam:  General: alert and cooperative Lochia: appropriate Uterine Fundus: firm Incision: healing well DVT Evaluation: No evidence of DVT seen on physical exam. Negative Homan's sign. No cords or calf tenderness. No significant calf/ankle edema.  Discharge Diagnoses: Term Pregnancy-delivered and postpartum tubal ligation  Discharge Information: Date: 09/25/2014 Activity: pelvic rest Diet: routine Medications: PNV, Ibuprofen and Percocet Condition: stable Instructions: refer to practice specific booklet Discharge to: home   Newborn Data: Live born female  Birth Weight: 8 lb 14.5 oz (4040 g) APGAR: 8, 9  Home with mother.  Estie Sproule G 09/25/2014, 8:43 AM

## 2014-09-28 ENCOUNTER — Encounter (HOSPITAL_COMMUNITY): Payer: Self-pay | Admitting: Obstetrics & Gynecology

## 2014-09-28 NOTE — Progress Notes (Signed)
Ur chart review completed.  

## 2014-11-09 ENCOUNTER — Other Ambulatory Visit: Payer: Self-pay | Admitting: Obstetrics & Gynecology

## 2014-11-10 LAB — CYTOLOGY - PAP
